# Patient Record
Sex: Male | Born: 1985 | Race: Black or African American | Hispanic: No | Marital: Married | Smoking: Current every day smoker
Health system: Southern US, Community
[De-identification: ages and names within clinical notes are randomized; demographics above are authoritative.]

## PROBLEM LIST (undated history)

## (undated) DIAGNOSIS — R011 Cardiac murmur, unspecified: Secondary | ICD-10-CM

## (undated) DIAGNOSIS — J45909 Unspecified asthma, uncomplicated: Secondary | ICD-10-CM

## (undated) DIAGNOSIS — T7840XA Allergy, unspecified, initial encounter: Secondary | ICD-10-CM

## (undated) HISTORY — PX: COLONOSCOPY: SHX174

## (undated) HISTORY — DX: Allergy, unspecified, initial encounter: T78.40XA

## (undated) HISTORY — DX: Cardiac murmur, unspecified: R01.1

---
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
## 2007-12-26 ENCOUNTER — Emergency Department (HOSPITAL_COMMUNITY): Admission: EM | Admit: 2007-12-26 | Discharge: 2007-12-26 | Payer: Self-pay | Admitting: Emergency Medicine

## 2009-07-13 ENCOUNTER — Emergency Department (HOSPITAL_COMMUNITY): Admission: EM | Admit: 2009-07-13 | Discharge: 2009-07-13 | Payer: Self-pay | Admitting: Emergency Medicine

## 2009-10-07 ENCOUNTER — Emergency Department (HOSPITAL_COMMUNITY): Admission: EM | Admit: 2009-10-07 | Discharge: 2009-10-07 | Payer: Self-pay | Admitting: Emergency Medicine

## 2009-10-08 ENCOUNTER — Emergency Department (HOSPITAL_COMMUNITY): Admission: EM | Admit: 2009-10-08 | Discharge: 2009-10-08 | Payer: Self-pay | Admitting: Emergency Medicine

## 2010-03-22 ENCOUNTER — Emergency Department (HOSPITAL_COMMUNITY)
Admission: EM | Admit: 2010-03-22 | Discharge: 2010-03-22 | Payer: Self-pay | Source: Home / Self Care | Admitting: Emergency Medicine

## 2010-03-27 LAB — URINALYSIS, ROUTINE W REFLEX MICROSCOPIC
Bilirubin Urine: NEGATIVE
Ketones, ur: NEGATIVE mg/dL
Leukocytes, UA: NEGATIVE
Nitrite: NEGATIVE
Protein, ur: NEGATIVE mg/dL
Specific Gravity, Urine: 1.03 (ref 1.005–1.030)
Urine Glucose, Fasting: NEGATIVE mg/dL
Urobilinogen, UA: 0.2 mg/dL (ref 0.0–1.0)
pH: 5.5 (ref 5.0–8.0)

## 2010-03-27 LAB — URINE MICROSCOPIC-ADD ON

## 2010-06-25 ENCOUNTER — Emergency Department (HOSPITAL_COMMUNITY)
Admission: EM | Admit: 2010-06-25 | Discharge: 2010-06-25 | Disposition: A | Discharge status: Home / Self Care | Payer: Self-pay | Attending: Emergency Medicine | Admitting: Emergency Medicine

## 2010-06-25 DIAGNOSIS — R040 Epistaxis: Secondary | ICD-10-CM | POA: Insufficient documentation

## 2010-07-28 NOTE — Procedures (Signed)
Dennis Dyer, Dennis Dyer              ACCOUNT NO.:  [id number]   MEDICAL RECORD NO.:  [id number]          PATIENT TYPE:  EMS   LOCATION:  ED                            FACILITY:  [facility]   PHYSICIAN:  Edward L. Juanetta Gosling, M.D.DATE OF BIRTH:  05/18/1985   DATE OF PROCEDURE:  08/09/2004  DATE OF DISCHARGE:  08/09/2004                                EKG INTERPRETATION   Time is 1428, Aug 09, 2004.   The rhythm is sinus rhythm with a rate in the 60s. There is ST elevation  which is widespread and very well may indicate pericarditis versus early  repolarization. This could represent something like a myocardial infarction,  but with the patient's age, I think it is much more likely to represent  pericarditis or early repolarization, but clinical correlation is suggested.  Abnormal electrocardiogram.       ELH/MEDQ  D:  08/11/2004  T:  08/12/2004  Job:  [id number]

## 2010-07-28 NOTE — Procedures (Signed)
[name], Dennis Dyer              ACCOUNT NO.:  [id number]   MEDICAL RECORD NO.:  [id number]          PATIENT TYPE:  EMS   LOCATION:  ED                            FACILITY:  [facility]   PHYSICIAN:  Edward L. Juanetta Gosling, M.D.DATE OF BIRTH:  [birth year]   DATE OF PROCEDURE:  08/09/2004  DATE OF DISCHARGE:  08/09/2004                                EKG INTERPRETATION   TIME OF PROCEDURE:  1438.   PROCEDURE:  EKG.   DESCRIPTION OF PROCEDURE:  The rhythm is a sinus rhythm with a rate of 60.  There is left ventricular hypertrophy.  There is widespread ST elevation.  This could be from pericarditis or early repolarization.  Clinical  correlation is suggested.       ELH/MEDQ  D:  08/21/2004  T:  08/21/2004  Job:  [id number]

## 2010-08-16 ENCOUNTER — Emergency Department (HOSPITAL_COMMUNITY): Payer: Self-pay

## 2010-08-16 ENCOUNTER — Emergency Department (HOSPITAL_COMMUNITY)
Admission: EM | Admit: 2010-08-16 | Discharge: 2010-08-16 | Disposition: A | Discharge status: Home / Self Care | Payer: Self-pay | Attending: Emergency Medicine | Admitting: Emergency Medicine

## 2010-08-16 DIAGNOSIS — Y998 Other external cause status: Secondary | ICD-10-CM | POA: Insufficient documentation

## 2010-08-16 DIAGNOSIS — F17200 Nicotine dependence, unspecified, uncomplicated: Secondary | ICD-10-CM | POA: Insufficient documentation

## 2010-08-16 DIAGNOSIS — R296 Repeated falls: Secondary | ICD-10-CM | POA: Insufficient documentation

## 2010-08-16 DIAGNOSIS — M65839 Other synovitis and tenosynovitis, unspecified forearm: Secondary | ICD-10-CM | POA: Insufficient documentation

## 2010-08-16 DIAGNOSIS — Y92009 Unspecified place in unspecified non-institutional (private) residence as the place of occurrence of the external cause: Secondary | ICD-10-CM | POA: Insufficient documentation

## 2010-09-08 ENCOUNTER — Emergency Department (HOSPITAL_COMMUNITY)
Admission: EM | Admit: 2010-09-08 | Discharge: 2010-09-08 | Disposition: A | Discharge status: Home / Self Care | Payer: Self-pay | Attending: Emergency Medicine | Admitting: Emergency Medicine

## 2010-09-08 DIAGNOSIS — M79609 Pain in unspecified limb: Secondary | ICD-10-CM | POA: Insufficient documentation

## 2010-09-08 DIAGNOSIS — M6530 Trigger finger, unspecified finger: Secondary | ICD-10-CM | POA: Insufficient documentation

## 2014-09-25 ENCOUNTER — Emergency Department (HOSPITAL_COMMUNITY)
Admission: EM | Admit: 2014-09-25 | Discharge: 2014-09-25 | Disposition: A | Discharge status: Home / Self Care | Payer: Self-pay | Attending: Emergency Medicine | Admitting: Emergency Medicine

## 2014-09-25 ENCOUNTER — Encounter (HOSPITAL_COMMUNITY): Payer: Self-pay | Admitting: Emergency Medicine

## 2014-09-25 ENCOUNTER — Emergency Department (HOSPITAL_COMMUNITY): Payer: Self-pay

## 2014-09-25 DIAGNOSIS — Y9289 Other specified places as the place of occurrence of the external cause: Secondary | ICD-10-CM | POA: Insufficient documentation

## 2014-09-25 DIAGNOSIS — S6992XA Unspecified injury of left wrist, hand and finger(s), initial encounter: Secondary | ICD-10-CM | POA: Insufficient documentation

## 2014-09-25 DIAGNOSIS — Y990 Civilian activity done for income or pay: Secondary | ICD-10-CM | POA: Insufficient documentation

## 2014-09-25 DIAGNOSIS — Z720 Tobacco use: Secondary | ICD-10-CM | POA: Insufficient documentation

## 2014-09-25 DIAGNOSIS — J45909 Unspecified asthma, uncomplicated: Secondary | ICD-10-CM | POA: Insufficient documentation

## 2014-09-25 DIAGNOSIS — X503XXA Overexertion from repetitive movements, initial encounter: Secondary | ICD-10-CM

## 2014-09-25 DIAGNOSIS — M25532 Pain in left wrist: Secondary | ICD-10-CM

## 2014-09-25 DIAGNOSIS — Y9389 Activity, other specified: Secondary | ICD-10-CM | POA: Insufficient documentation

## 2014-09-25 DIAGNOSIS — T148XXA Other injury of unspecified body region, initial encounter: Secondary | ICD-10-CM

## 2014-09-25 DIAGNOSIS — X58XXXA Exposure to other specified factors, initial encounter: Secondary | ICD-10-CM | POA: Insufficient documentation

## 2014-09-25 HISTORY — DX: Unspecified asthma, uncomplicated: J45.909

## 2014-09-25 MED ORDER — OXYCODONE-ACETAMINOPHEN 5-325 MG PO TABS
2.0000 | ORAL_TABLET | Freq: Once | ORAL | Status: AC
  Administered 2014-09-25: 2 via ORAL
  Filled 2014-09-25: qty 2

## 2014-09-25 MED ORDER — NAPROXEN 375 MG PO TABS: 375.0000 mg | ORAL_TABLET | Freq: Three times a day (TID) | ORAL | Status: DC | PRN

## 2014-09-25 MED ORDER — IBUPROFEN 400 MG PO TABS
600.0000 mg | ORAL_TABLET | Freq: Once | ORAL | Status: AC
Start: 2014-09-25 — End: 2014-09-25
  Administered 2014-09-25: 800 mg via ORAL
  Filled 2014-09-25: qty 2

## 2014-09-25 MED ORDER — TRAMADOL HCL 50 MG PO TABS: 50.0000 mg | ORAL_TABLET | Freq: Four times a day (QID) | ORAL | Status: DC | PRN

## 2014-09-25 NOTE — Discharge Instructions (Signed)
Muscle Strain A muscle strain is an injury that occurs when a muscle is stretched beyond its normal length. Usually a small number of muscle fibers are torn when this happens. Muscle strain is rated in degrees. First-degree strains have the least amount of muscle fiber tearing and pain. Second-degree and third-degree strains have increasingly more tearing and pain.  Usually, recovery from muscle strain takes 1-2 weeks. Complete healing takes 5-6 weeks.  CAUSES  Muscle strain happens when a sudden, violent force placed on a muscle stretches it too far. This may occur with lifting, sports, or a fall.  RISK FACTORS Muscle strain is especially common in athletes.  SIGNS AND SYMPTOMS At the site of the muscle strain, there may be:  Pain.  Bruising.  Swelling.  Difficulty using the muscle due to pain or lack of normal function. DIAGNOSIS  Your health care provider will perform a physical exam and ask about your medical history. TREATMENT  Often, the best treatment for a muscle strain is resting, icing, and applying cold compresses to the injured area.  HOME CARE INSTRUCTIONS   Use the PRICE method of treatment to promote muscle healing during the first 2-3 days after your injury. The PRICE method involves:  Protecting the muscle from being injured again.  Restricting your activity and resting the injured body part.  Icing your injury. To do this, put ice in a plastic bag. Place a towel between your skin and the bag. Then, apply the ice and leave it on from 15-20 minutes each hour. After the third day, switch to moist heat packs.  Apply compression to the injured area with a splint or elastic bandage. Be careful not to wrap it too tightly. This may interfere with blood circulation or increase swelling.  Elevate the injured body part above the level of your heart as often as you can.  Only take over-the-counter or prescription medicines for pain, discomfort, or fever as directed by your  health care provider.  Warming up prior to exercise helps to prevent future muscle strains. SEEK MEDICAL CARE IF:   You have increasing pain or swelling in the injured area.  You have numbness, tingling, or a significant loss of strength in the injured area. MAKE SURE YOU:   Understand these instructions.  Will watch your condition.  Will get help right away if you are not doing well or get worse. Document Released: 02/26/2005 Document Revised: 12/17/2012 Document Reviewed: 09/25/2012 [facility] Patient Information 2015 [place], [place]. This information is not intended to replace advice given to you by your health care provider. Make sure you discuss any questions you have with your health care provider.  Wrist Pain A wrist sprain happens when the bands of tissue that hold the wrist joints together (ligament) stretch too much or tear. A wrist strain happens when muscles or bands of tissue that connect muscles to bones (tendons) are stretched or pulled. HOME CARE  Put ice on the injured area.  Put ice in a plastic bag.  Place a towel between your skin and the bag.  Leave the ice on for 15-20 minutes, 03-04 times a day, for the first 2 days.  Raise (elevate) the injured wrist to lessen puffiness (swelling).  Rest the injured wrist for at least 48 hours or as told by your doctor.  Wear a splint, cast, or an elastic wrap as told by your doctor.  Only take medicine as told by your doctor.  Follow up with your doctor as told. This is  important. GET HELP RIGHT AWAY IF:   The fingers are puffy, very red, white, or cold and blue.  The fingers lose feeling (numb) or tingle.  The pain gets worse.  It is hard to move the fingers. MAKE SURE YOU:   Understand these instructions.  Will watch your condition.  Will get help right away if you are not doing well or get worse. Document Released: 08/15/2007 Document Revised: 05/21/2011 Document Reviewed: 04/19/2010 [facility] Patient  Information 2015 [place], [place]. This information is not intended to replace advice given to you by your health care provider. Make sure you discuss any questions you have with your health care provider.

## 2014-09-25 NOTE — ED Notes (Signed)
Pt c/o left wrist pain x 2 months worsening today.

## 2014-10-03 NOTE — ED Provider Notes (Signed)
CSN: [id number]     Arrival date & time 09/25/14  1817 History   First MD Initiated Contact with Patient 09/25/14 1824     Chief Complaint  Patient presents with  . Wrist Pain     (Consider location/radiation/quality/duration/timing/severity/associated sxs/prior Treatment) HPI   29 year old male with left wrist pain. His left wrist has been "bugging" him for months. Worse the past couple days. 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 No numbness or tingling. Has been taking Tylenol with minimal relief. No fevers or chills. No rash. Denies any significant pain in any other joints.  Past Medical History  Diagnosis Date  . Asthma    History reviewed. No pertinent past surgical history. No family history on file. History  Substance Use Topics  . Smoking status: Current Every Day Smoker -- 1.00 packs/day    Types: Cigarettes  . Smokeless tobacco: Not on file  . Alcohol Use: Yes     Comment: occasional    Review of Systems  All systems reviewed and negative, other than as noted in HPI.   Allergies  Banana and Tomato  Home Medications   Prior to Admission medications   Medication Sig Start Date End Date Taking? Authorizing Provider  naproxen (NAPROSYN) 375 MG tablet Take 1 tablet (375 mg total) by mouth 3 (three) times daily with meals as needed. 09/25/14   Raeford Razor, MD  traMADol (ULTRAM) 50 MG tablet Take 1 tablet (50 mg total) by mouth every 6 (six) hours as needed. 09/25/14   Raeford Razor, MD   BP 113/60 mmHg  Pulse 84  Temp(Src) 98.4 F (36.9 C) (Oral)  Resp 22  Ht 5\' 10"  (1.778 m)  Wt 145 lb (65.772 kg)  BMI 20.81 kg/m2  SpO2 98% Physical Exam  Constitutional: He appears well-developed and well-nourished. No distress.  HENT:  Head: Normocephalic and atraumatic.  Eyes: Conjunctivae are normal. Right eye exhibits no discharge. Left eye exhibits no discharge.  Neck: Neck supple.   Cardiovascular: Normal rate, regular rhythm and normal heart sounds.  Exam reveals no gallop and no friction rub.   No murmur heard. Pulmonary/Chest: Effort normal and breath sounds normal. No respiratory distress.  Abdominal: Soft. He exhibits no distension. There is no tenderness.  Musculoskeletal: He exhibits edema and tenderness.  Swelling of the left wrist. Pain with range of motion. Neurovascularly intact distally.  Neurological: He is alert.  Skin: Skin is warm and dry.  Psychiatric: He has a normal mood and affect. His behavior is normal. Thought content normal.  Nursing note and vitals reviewed.   ED Course  Procedures (including critical care time) Labs Review Labs Reviewed - No data to display  Imaging Review No results found.   Dg Wrist Complete Left  09/25/2014   CLINICAL DATA:  Left wrist pain for 2 months known injury, initial encounter  EXAM: LEFT WRIST - COMPLETE 3+ VIEW  COMPARISON:  None.  FINDINGS: There is no evidence of fracture or dislocation. There is no evidence of arthropathy or other focal bone abnormality. Soft tissues are unremarkable.  IMPRESSION: No acute abnormality noted.   Electronically Signed   By: Alcide Clever M.D.   On: 09/25/2014 19:14   EKG Interpretation None      MDM   Final diagnoses:  Overuse injury  Left wrist pain    29 year old male with wrist pain. Likely overuse injury. 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 Imaged without acute abnormality. Neurovascular intact. Advised that he should try to rest the area the best he can. Splint given for comfort reasons. When necessary medicines.    Raeford Razor, MD 10/03/14 [phone]

## 2015-01-03 ENCOUNTER — Encounter (HOSPITAL_COMMUNITY): Payer: Self-pay | Admitting: Emergency Medicine

## 2015-01-03 ENCOUNTER — Emergency Department (HOSPITAL_COMMUNITY)
Admission: EM | Admit: 2015-01-03 | Discharge: 2015-01-03 | Disposition: A | Discharge status: Home / Self Care | Payer: Self-pay | Attending: Emergency Medicine | Admitting: Emergency Medicine

## 2015-01-03 DIAGNOSIS — K648 Other hemorrhoids: Secondary | ICD-10-CM | POA: Insufficient documentation

## 2015-01-03 DIAGNOSIS — K649 Unspecified hemorrhoids: Secondary | ICD-10-CM

## 2015-01-03 DIAGNOSIS — J45909 Unspecified asthma, uncomplicated: Secondary | ICD-10-CM | POA: Insufficient documentation

## 2015-01-03 DIAGNOSIS — Z720 Tobacco use: Secondary | ICD-10-CM | POA: Insufficient documentation

## 2015-01-03 MED ORDER — NITROGLYCERIN 0.4 % RE OINT: 1.0000 [in_us] | TOPICAL_OINTMENT | Freq: Two times a day (BID) | RECTAL | Status: DC

## 2015-01-03 NOTE — Discharge Instructions (Signed)
Hemorrhoids °Hemorrhoids are swollen veins around the rectum or anus. There are two types of hemorrhoids:  °1. Internal hemorrhoids. These occur in the veins just inside the rectum. They may poke through to the outside and become irritated and painful. °2. External hemorrhoids. These occur in the veins outside the anus and can be felt as a painful swelling or hard lump near the anus. °CAUSES °· Pregnancy.   °· Obesity.   °· Constipation or diarrhea.   °· Straining to have a bowel movement.   °· Sitting for long periods on the toilet. °· Heavy lifting or other activity that caused you to strain. °· Anal intercourse. °SYMPTOMS  °· Pain.   °· Anal itching or irritation.   °· Rectal bleeding.   °· Fecal leakage.   °· Anal swelling.   °· One or more lumps around the anus.   °DIAGNOSIS  °Your caregiver may be able to diagnose hemorrhoids by visual examination. Other examinations or tests that may be performed include:  °· Examination of the rectal area with a gloved hand (digital rectal exam).   °· Examination of anal canal using a small tube (scope).   °· A blood test if you have lost a significant amount of blood. °· A test to look inside the colon (sigmoidoscopy or colonoscopy). °TREATMENT °Most hemorrhoids can be treated at home. However, if symptoms do not seem to be getting better or if you have a lot of rectal bleeding, your caregiver may perform a procedure to help make the hemorrhoids get smaller or remove them completely. Possible treatments include:  °· Placing a rubber band at the base of the hemorrhoid to cut off the circulation (rubber band ligation).   °· Injecting a chemical to shrink the hemorrhoid (sclerotherapy).   °· Using a tool to burn the hemorrhoid (infrared light therapy).   °· Surgically removing the hemorrhoid (hemorrhoidectomy).   °· Stapling the hemorrhoid to block blood flow to the tissue (hemorrhoid stapling).   °HOME CARE INSTRUCTIONS  °· Eat foods with fiber, such as whole grains, beans,  nuts, fruits, and vegetables. Ask your doctor about taking products with added fiber in them (fiber supplements). °· Increase fluid intake. Drink enough water and fluids to keep your urine clear or pale yellow.   °· Exercise regularly.   °· Go to the bathroom when you have the urge to have a bowel movement. Do not wait.   °· Avoid straining to have bowel movements.   °· Keep the anal area dry and clean. Use wet toilet paper or moist towelettes after a bowel movement.   °· Medicated creams and suppositories may be used or applied as directed.   °· Only take over-the-counter or prescription medicines as directed by your caregiver.   °· Take warm sitz baths for 15-20 minutes, 3-4 times a day to ease pain and discomfort.   °· Place ice packs on the hemorrhoids if they are tender and swollen. Using ice packs between sitz baths may be helpful.   °¨ Put ice in a plastic bag.   °¨ Place a towel between your skin and the bag.   °¨ Leave the ice on for 15-20 minutes, 3-4 times a day.   °· Do not use a donut-shaped pillow or sit on the toilet for long periods. This increases blood pooling and pain.   °SEEK MEDICAL CARE IF: °· You have increasing pain and swelling that is not controlled by treatment or medicine. °· You have uncontrolled bleeding. °· You have difficulty or you are unable to have a bowel movement. °· You have pain or inflammation outside the area of the hemorrhoids. °MAKE SURE YOU: °· Understand these instructions. °·   Will watch your condition. °· Will get help right away if you are not doing well or get worse. °  °This information is not intended to replace advice given to you by your health care provider. Make sure you discuss any questions you have with your health care provider. °  °Document Released: 02/24/2000 Document Revised: 02/13/2012 Document Reviewed: 01/01/2012 °Elsevier Interactive Patient Education ©2016 [employer]vier Inc. ° °Disposable Sitz Bath °A disposable sitz bath is a plastic basin that fits over  the toilet. A bag is hung above the toilet and is connected to a tube that opens into the disposable sitz bath. The bag is filled with warm water that can flow into the basin through the tube.  °HOW TO USE A DISPOSABLE SITZ BATH °3. Close the clamp on the tubing before filling the bag with water. This is to prevent leakage. °4. Fill the sitz bath basin and the plastic bag with warm water. °5. Place the filled basin on the toilet with the seat raised. Make sure the overflow opening is facing toward the back of the toilet. °6. Hang the filled plastic bag overhead on a hook or towel rack close to the toilet. When the bag is unclamped, a steady stream of water will flow from the bag, through the tubing, and into the basin. °7. Attach the tubing to the opening on the basin. °8. Sit on the basin positioned on the toilet seat and release the clamp. This will allow warm water to flush the area around your genitals and anus (perineum). °9. Remain sitting on the basin for approximately 15 to 20 minutes. °10. Stand up and pat the perineum area dry. If needed, apply clean bandages (dressings) to the affected area. °11. Tip the basin into the toilet to remove any remaining water and flush the toilet. °12. Wash the basin with warm water and soap. Let it dry in the sink. °13. Store the basin and tubing in a clean, dry area. °14. Wash your hands with soap and water. °SEEK MEDICAL CARE IF: °You get worse instead of better. Stop the sitz baths if you get worse. °MAKE SURE YOU: °· Understand these instructions. °· Will watch your condition. °· Will get help right away if you are not doing well or get worse. °  °This information is not intended to replace advice given to you by your health care provider. Make sure you discuss any questions you have with your health care provider. °  °Document Released: 08/28/2011 Document Revised: 11/21/2011 Document Reviewed: 08/28/2011 °Elsevier Interactive Patient Education ©2016 Elsevier Inc. ° °

## 2015-01-03 NOTE — ED Provider Notes (Signed)
CSN: [id number]     Arrival date & time 01/03/15  1512 History   First MD Initiated Contact with Patient 01/03/15 1541     Chief Complaint  Patient presents with  . Hemorrhoids     (Consider location/radiation/quality/duration/timing/severity/associated sxs/prior Treatment) HPI Comments: 7/10 times has pain with BM Hemorrhoids coming out, pushing them back in, one year When it first happened came to ED Tried witch hazel pads Preparation h cream Suppository Sitz baths helped, but next day returns Bleeding with most BM, bright red blood No bleeding between BM Pain rectal worse with BM, "spikes" 8/10   Past Medical History  Diagnosis Date  . Asthma    History reviewed. No pertinent past surgical history. History reviewed. No pertinent family history. Social History  Substance Use Topics  . Smoking status: Current Every Day Smoker -- 1.00 packs/day    Types: Cigarettes  . Smokeless tobacco: None  . Alcohol Use: Yes     Comment: occasional    Review of Systems  Constitutional: Negative for fever.  HENT: Negative for sore throat.   Eyes: Negative for visual disturbance.  Respiratory: Negative for shortness of breath.   Cardiovascular: Negative for chest pain.  Gastrointestinal: Positive for anal bleeding and rectal pain. Negative for nausea, vomiting, abdominal pain, diarrhea and constipation.  Genitourinary: Negative for difficulty urinating.  Musculoskeletal: Negative for back pain and neck stiffness.  Skin: Negative for rash.  Neurological: Negative for syncope and headaches.      Allergies  Banana and Tomato  Home Medications   Prior to Admission medications   Medication Sig Start Date End Date Taking? Authorizing Provider  Nitroglycerin 0.4 % OINT Place 1 inch rectally every 12 (twelve) hours. 01/03/15 01/24/15  Alvira Monday, MD   BP 122/74 mmHg  Pulse 61  Temp(Src) 97.8 F (36.6 C) (Oral)  Resp 14  Ht  (1.778 m)  Wt 145 lb (65.772 kg)  BMI  20.81 kg/m2  SpO2 100% Physical Exam  Constitutional: He is oriented to person, place, and time. He appears well-developed and well-nourished. No distress.  HENT:  Head: Normocephalic and atraumatic.  Eyes: Conjunctivae and EOM are normal.  Neck: Normal range of motion.  Cardiovascular: Normal rate, regular rhythm, normal heart sounds and intact distal pulses.  Exam reveals no gallop and no friction rub.   No murmur heard. Pulmonary/Chest: Effort normal and breath sounds normal. No respiratory distress. He has no wheezes. He has no rales.  Abdominal: Soft. He exhibits no distension. There is no tenderness. There is no guarding.  Genitourinary: Rectal exam shows internal hemorrhoid (palpated ) and tenderness. Rectal exam shows no external hemorrhoid, no fissure and anal tone normal (high rectal tone).  Musculoskeletal: He exhibits no edema.  Neurological: He is alert and oriented to person, place, and time.  Skin: Skin is warm and dry. He is not diaphoretic.  Nursing note and vitals reviewed.   ED Course  Procedures (including critical care time) Labs Review Labs Reviewed - No data to display  Imaging Review No results found. I have personally reviewed and evaluated these images and lab results as part of my medical decision-making.   EKG Interpretation None      MDM   Final diagnoses:  Hemorrhoids, unspecified hemorrhoid type   28yo male with history of hemorrhoids for one year with colonoscopy 6 mos ago confirming presence of internal hemorrhoids, presents with concern for continuing rectal bleeding with BM, rectal pain with BM after trying several conservative treatments.  Exam  shows no external hemorrhoids, however likely previously diagnosed internal hemorrhoids palpated on exam and hx is consistent with this. Given description and pt hemodynamically stable,, do not feel blood work is indicated at this time. Bleedling likely from hemorrhodis as it is continuing from time  previous to colonoscopy confirming this.  Given pt has tried several conservative treatments and comes seeking possible surgical removal, will refer to outpatient surgery.  Recommend continuing sitz baths, wrote rx for nitroglycerin for rectal spasm relief. Patient discharged in stable condition with understanding of reasons to return.     Alvira MondayErin Ousmane Seeman, MD 01/04/15 1340

## 2015-01-03 NOTE — ED Notes (Signed)
Having hemorrhoids for last year.  Rates pain 8/10, but currently not having any pain.

## 2015-04-19 ENCOUNTER — Encounter (HOSPITAL_COMMUNITY): Payer: Self-pay

## 2015-04-19 ENCOUNTER — Emergency Department (HOSPITAL_COMMUNITY): Payer: Self-pay

## 2015-04-19 ENCOUNTER — Emergency Department (HOSPITAL_COMMUNITY)
Admission: EM | Admit: 2015-04-19 | Discharge: 2015-04-19 | Disposition: A | Discharge status: Home / Self Care | Payer: Self-pay | Attending: Emergency Medicine | Admitting: Emergency Medicine

## 2015-04-19 DIAGNOSIS — Y998 Other external cause status: Secondary | ICD-10-CM | POA: Insufficient documentation

## 2015-04-19 DIAGNOSIS — W458XXA Other foreign body or object entering through skin, initial encounter: Secondary | ICD-10-CM | POA: Insufficient documentation

## 2015-04-19 DIAGNOSIS — F17210 Nicotine dependence, cigarettes, uncomplicated: Secondary | ICD-10-CM | POA: Insufficient documentation

## 2015-04-19 DIAGNOSIS — J45909 Unspecified asthma, uncomplicated: Secondary | ICD-10-CM | POA: Insufficient documentation

## 2015-04-19 DIAGNOSIS — Y9389 Activity, other specified: Secondary | ICD-10-CM | POA: Insufficient documentation

## 2015-04-19 DIAGNOSIS — S60454A Superficial foreign body of right ring finger, initial encounter: Secondary | ICD-10-CM | POA: Insufficient documentation

## 2015-04-19 DIAGNOSIS — Y9289 Other specified places as the place of occurrence of the external cause: Secondary | ICD-10-CM | POA: Insufficient documentation

## 2015-04-19 DIAGNOSIS — S60459A Superficial foreign body of unspecified finger, initial encounter: Secondary | ICD-10-CM

## 2015-04-19 MED ORDER — LIDOCAINE HCL (PF) 1 % IJ SOLN
5.0000 mL | Freq: Once | INTRAMUSCULAR | Status: AC
Start: 2015-04-19 — End: 2015-04-19
  Administered 2015-04-19: 5 mL
  Filled 2015-04-19: qty 5

## 2015-04-19 MED ORDER — TETANUS-DIPHTH-ACELL PERTUSSIS 5-2.5-18.5 LF-MCG/0.5 IM SUSP
0.5000 mL | Freq: Once | INTRAMUSCULAR | Status: AC
  Administered 2015-04-19: 0.5 mL via INTRAMUSCULAR
  Filled 2015-04-19: qty 0.5

## 2015-04-19 NOTE — Discharge Instructions (Signed)
Sliver Removal, Care After A sliver--also called a splinter--is a small and thin broken piece of an object that gets stuck (embedded) under the skin. A sliver can create a deep wound that can easily become infected. It is important to care for the wound after a sliver is removed to help prevent infection and other problems from developing. WHAT TO EXPECT AFTER THE PROCEDURE Slivers often break into smaller pieces when they are removed. If pieces of your sliver broke off and stayed in your skin, you will eventually see them working themselves out and you may feel some pain at the wound site. This is normal. HOME CARE INSTRUCTIONS  Keep all follow-up visits as directed by your health care provider. This is important.  There are many different ways to close and cover a wound, including stitches (sutures) and adhesive strips. Follow your health care provider's instructions about:  Wound care.  Bandage (dressing) changes and removal.  Wound closure removal.  Check the wound site every day for signs of infection. Watch for:  Red streaks coming from the wound.  Fever.  Redness or tenderness around the wound.  Fluid, blood, or pus coming from the wound.  A bad smell coming from the wound. SEEK MEDICAL CARE IF:  You think that a piece of the sliver is still in your skin.  Your wound was closed, as with sutures, and the edges of the wound break open.  You have signs of infection, including:  New or worsening redness around the wound.  New or worsening tenderness around the wound.  Fluid, blood, or pus coming from the wound.  A bad smell coming from the wound or dressing. SEEK IMMEDIATE MEDICAL CARE IF: You have any of the following signs of infection:  Red streaks coming from the wound.  An unexplained fever.   This information is not intended to replace advice given to you by your health care provider. Make sure you discuss any questions you have with your health care  provider.   Document Released: 02/24/2000 Document Revised: 03/19/2014 Document Reviewed: 10/29/2013 Elsevier Interactive Patient Education 2016 [employer]vier Inc.  

## 2015-04-19 NOTE — ED Notes (Signed)
Pt reports has a piece of glass in r ring finger x 3 months.

## 2015-04-19 NOTE — ED Provider Notes (Signed)
CSN: [id number]     Arrival date & time 04/19/15  4332 History   First MD Initiated Contact with Patient 04/19/15 0900     Chief Complaint  Patient presents with  . Foreign Body in Skin     (Consider location/radiation/quality/duration/timing/severity/associated sxs/prior Treatment) HPI Comments: Patient is a 30 year old male who presents to the emergency department with complaint of "I have a piece of glass in my finger" The patient states that approximately 3 months ago he was helping someone move and he got 2 pieces of glass in the right ring finger. He was able to remove one of them, but was unable to remove the other. He presents now because there is a sore raised area on the tip of the ring finger. He has not had fever or chills. There's been minimal drainage from this area. He has not noted red streaking. He states that he is not on any anticoagulation medications. And he does not have any medical issues that would reduce his immune response..  The history is provided by the patient.    Past Medical History  Diagnosis Date  . Asthma    History reviewed. No pertinent past surgical history. No family history on file. Social History  Substance Use Topics  . Smoking status: Current Every Day Smoker -- 1.00 packs/day    Types: Cigarettes  . Smokeless tobacco: None  . Alcohol Use: Yes     Comment: occasional    Review of Systems  Constitutional: Negative for activity change.       All ROS Neg except as noted in HPI  HENT: Negative for nosebleeds.   Eyes: Negative for photophobia and discharge.  Respiratory: Negative for cough, shortness of breath and wheezing.   Cardiovascular: Negative for chest pain and palpitations.  Gastrointestinal: Negative for abdominal pain and blood in stool.  Genitourinary: Negative for dysuria, frequency and hematuria.  Musculoskeletal: Negative for back pain, arthralgias and neck pain.  Skin: Negative.   Neurological: Negative for dizziness,  seizures and speech difficulty.  Psychiatric/Behavioral: Negative for hallucinations and confusion.      Allergies  Banana and Tomato  Home Medications   Prior to Admission medications   Medication Sig Start Date End Date Taking? Authorizing Provider  phenylephrine-shark liver oil-mineral oil-petrolatum (PREPARATION H) 0.25-3-14-71.9 % rectal ointment Place 1 application rectally 2 (two) times daily as needed for hemorrhoids.   Yes Historical Provider, MD  Nitroglycerin 0.4 % OINT Place 1 inch rectally every 12 (twelve) hours. 01/03/15 01/24/15  Alvira Monday, MD   BP 122/77 mmHg  Pulse 56  Temp(Src) 97.7 F (36.5 C) (Oral)  Resp 16  Ht  (1.803 m)  Wt 65.772 kg  BMI 20.23 kg/m2  SpO2 100% Physical Exam  Constitutional: He is oriented to person, place, and time. He appears well-developed and well-nourished.  Non-toxic appearance.  HENT:  Head: Normocephalic.  Right Ear: Tympanic membrane and external ear normal.  Left Ear: Tympanic membrane and external ear normal.  Eyes: EOM and lids are normal. Pupils are equal, round, and reactive to light.  Neck: Normal range of motion. Neck supple. Carotid bruit is not present.  Cardiovascular: Normal rate, regular rhythm, normal heart sounds, intact distal pulses and normal pulses.   Pulmonary/Chest: Breath sounds normal. No respiratory distress.  Abdominal: Soft. Bowel sounds are normal. There is no tenderness. There is no guarding.  Musculoskeletal: Normal range of motion.  There is a small raised tender area on the palmar surface of the distal right ring  finger. There is full range of motion of all fingers of the right and left hand.  Lymphadenopathy:       Head (right side): No submandibular adenopathy present.       Head (left side): No submandibular adenopathy present.    He has no cervical adenopathy.  Neurological: He is alert and oriented to person, place, and time. He has normal strength. No cranial nerve deficit or  sensory deficit.  Skin: Skin is warm and dry.  Psychiatric: He has a normal mood and affect. His speech is normal.  Nursing note and vitals reviewed.   ED Course  .Foreign Body Removal Date/Time: 04/19/2015 11:13 AM Performed by: Ivery Quale Authorized by: Ivery Quale Consent: Verbal consent obtained. Risks and benefits: risks, benefits and alternatives were discussed Consent given by: patient Patient understanding: patient states understanding of the procedure being performed Patient identity confirmed: arm band Time out: Immediately prior to procedure a "time out" was called to verify the correct patient, procedure, equipment, support staff and site/side marked as required. Body area: skin General location: upper extremity Location details: right ring finger Anesthesia: digital block Local anesthetic: lidocaine 1% without epinephrine Patient sedated: no Localization method: xray and prob. Removal mechanism: scalpel and forceps Dressing: dressing applied Tendon involvement: none Depth: subcutaneous Complexity: simple 1 objects recovered. Objects recovered: glass Post-procedure assessment: foreign body removed Patient tolerance: Patient tolerated the procedure well with no immediate complications   (including critical care time) Labs Review Labs Reviewed - No data to display  Imaging Review Dg Finger Ring Right  04/19/2015  CLINICAL DATA:  Pt states he was helping someone move about 2 months ago and got 2 pieces of glass in right ring finger/he states he got one piece out but still has one in the tip of finger EXAM: RIGHT RING FINGER 2+V COMPARISON:  None. FINDINGS: There is a subtle linear density within the anterior soft tissues of the right ring finger tip measuring approximate 3.5 mm in length. This is consistent with a retained piece of glass. No other evidence of a radiopaque foreign body. No fracture. Joints normally spaced and aligned. No bone resorption is seen to  suggest osteomyelitis. IMPRESSION: Small radiopaque foreign body consistent with glass in the anterior soft tissues of the right ring finger tip. Electronically Signed   By: Amie Portland M.D.   On: 04/19/2015 09:24   I have personally reviewed and evaluated these images and lab results as part of my medical decision-making.   EKG Interpretation None      MDM Vital signs are well within normal limits. X-ray of the ring finger shows a 3.5 mm foreign body in the anterior soft tissue of the ring finger.  The foreign body was removed and the finger irrigated. The patient's tetanus status was updated, and sterile bandage was applied. Discussed with the patient that in the event any tiny piece of the glass may not have been retrieved that it will be removed by the body itself. Discussed with the patient signs of infection. Patient is in agreement with the discharge plan.    Final diagnoses:  None    *I have reviewed nursing notes, vital signs, and all appropriate lab and imaging results for this patient.Ivery Quale, PA-C 04/19/15 1116  Linwood Dibbles, MD 04/19/15 [phone]

## 2015-09-28 ENCOUNTER — Emergency Department (HOSPITAL_COMMUNITY)
Admission: EM | Admit: 2015-09-28 | Discharge: 2015-09-28 | Disposition: A | Discharge status: Home / Self Care | Payer: Self-pay | Attending: Emergency Medicine | Admitting: Emergency Medicine

## 2015-09-28 ENCOUNTER — Emergency Department (HOSPITAL_COMMUNITY): Payer: Self-pay

## 2015-09-28 ENCOUNTER — Encounter (HOSPITAL_COMMUNITY): Payer: Self-pay | Admitting: Emergency Medicine

## 2015-09-28 DIAGNOSIS — F17210 Nicotine dependence, cigarettes, uncomplicated: Secondary | ICD-10-CM | POA: Insufficient documentation

## 2015-09-28 DIAGNOSIS — K648 Other hemorrhoids: Secondary | ICD-10-CM | POA: Insufficient documentation

## 2015-09-28 DIAGNOSIS — J45909 Unspecified asthma, uncomplicated: Secondary | ICD-10-CM | POA: Insufficient documentation

## 2015-09-28 DIAGNOSIS — M546 Pain in thoracic spine: Secondary | ICD-10-CM

## 2015-09-28 NOTE — Discharge Instructions (Signed)

## 2015-09-28 NOTE — ED Provider Notes (Signed)
CSN: [id number]     Arrival date & time 09/28/15  2049 History   First MD Initiated Contact with Patient 09/28/15 2122     Chief Complaint  Patient presents with  . Hemorrhoids  . Back Pain    HPI   30 year old male presents today with numerous complaints. Patient reports that over the last 3 years he's had hemorrhoids. He describes them as intermittent, and uncomfortable. He reports that he has intermittent bloody stools, described as streaking. He reports that the hemorrhoid bulges out of the rectum when he has a bowel movement, he reports he is able to manually reduce it on his own. He denies any significant pain with this. Patient reports symptoms were worse this morning, but resolved at this time my evaluation.  Patient also reports that he is having left back pain. He describes this as a "lump" feeling in his mid thoracic and posterior rib. Patient is concerned that there is something in his lungs, he denies any cough, fever, weight loss, night sweats. He denies any history of cancer, but does report that he is a smoker. Patient requesting chest x-ray for further evaluation.  Past Medical History  Diagnosis Date  . Asthma    History reviewed. No pertinent past surgical history. History reviewed. No pertinent family history. Social History  Substance Use Topics  . Smoking status: Current Every Day Smoker -- 1.00 packs/day    Types: Cigarettes  . Smokeless tobacco: None  . Alcohol Use: Yes     Comment: occasional    Review of Systems  All other systems reviewed and are negative.     Allergies  Banana and Tomato  Home Medications   Prior to Admission medications   Medication Sig Start Date End Date Taking? Authorizing Provider  Nitroglycerin 0.4 % OINT Place 1 inch rectally every 12 (twelve) hours. 01/03/15 01/24/15  Alvira Monday, MD  phenylephrine-shark liver oil-mineral oil-petrolatum (PREPARATION H) 0.25-3-14-71.9 % rectal ointment Place 1 application rectally 2  (two) times daily as needed for hemorrhoids.    Historical Provider, MD   Pulse 71  Temp(Src) 98.9 F (37.2 C) (Oral)  Resp 16  Ht  (1.778 m)  Wt 65.403 kg  BMI 20.69 kg/m2  SpO2 100%   Physical Exam  Constitutional: He is oriented to person, place, and time. He appears well-developed and well-nourished.  HENT:  Head: Normocephalic and atraumatic.  Eyes: Conjunctivae are normal. Pupils are equal, round, and reactive to light. Right eye exhibits no discharge. Left eye exhibits no discharge. No scleral icterus.  Neck: Normal range of motion. No JVD present. No tracheal deviation present.  Pulmonary/Chest: Effort normal and breath sounds normal. No stridor. No respiratory distress. He has no wheezes. He has no rales. He exhibits no tenderness.  Musculoskeletal: Normal range of motion. He exhibits no edema or tenderness.  Neurological: He is alert and oriented to person, place, and time. Coordination normal.  Skin: Skin is warm and dry. No rash noted. No erythema. No pallor.  Psychiatric: He has a normal mood and affect. His behavior is normal. Judgment and thought content normal.  Nursing note and vitals reviewed.   ED Course  Procedures (including critical care time) Labs Review Labs Reviewed - No data to display  Imaging Review Dg Chest 2 View  09/28/2015  CLINICAL DATA:  LEFT upper back pain, rib pain for several months. History of asthma. EXAM: CHEST  2 VIEW COMPARISON:  Chest radiograph October 07, 2009 FINDINGS: Cardiomediastinal silhouette is normal. The  lungs are clear without pleural effusions or focal consolidations. Trachea projects midline and there is no pneumothorax. Soft tissue planes and included osseous structures are non-suspicious. IMPRESSION: Normal chest. Electronically Signed   By: Awilda Metroourtnay  Bloomer M.D.   On: 09/28/2015 22:47   I have personally reviewed and evaluated these images and lab results as part of my medical decision-making.   EKG  Interpretation None      MDM   Final diagnoses:  Internal hemorrhoid  Left-sided thoracic back pain   Labs:  Imaging:DG chest 2 view  Consults:  Therapeutics:  Discharge Meds:   Assessment/Plan:  Patient presents with numerous complaints. Patient has what is likely reducible internal hemorrhoids. Patient reports she is having minimal discomfort at the time my evaluation. He will need follow-up with proctologist or gastroenterologist, no need to proceed with rectal exam on today's exam as he will be following up with him for definitive management. Patient has no red flags for cancer. Patient also having back pain, he reports he feels a "lump in the posterior aspect of his back and is worried that it might be in his lungs. He is requesting chest x-ray. Chest x-ray normal here, again no cancerous signs or symptoms. Patient encouraged to discontinue smoking, follow-up with primary care for reevaluation and further management. Patient had no further questions or concerns at the time discharge.       Eyvonne MechanicJeffrey Garlon Tuggle, PA-C 09/29/15 [id number]  Eber HongBrian Miller, MD 09/30/15 [phone]

## 2015-09-28 NOTE — ED Notes (Signed)
Pt states he has hemorrhoids and has upper L. Sided back pain.

## 2016-07-04 ENCOUNTER — Emergency Department (HOSPITAL_COMMUNITY)
Admission: EM | Admit: 2016-07-04 | Discharge: 2016-07-04 | Disposition: A | Discharge status: Home / Self Care | Payer: Self-pay | Attending: Emergency Medicine | Admitting: Emergency Medicine

## 2016-07-04 ENCOUNTER — Encounter (HOSPITAL_COMMUNITY): Payer: Self-pay | Admitting: Cardiology

## 2016-07-04 DIAGNOSIS — K648 Other hemorrhoids: Secondary | ICD-10-CM | POA: Insufficient documentation

## 2016-07-04 DIAGNOSIS — F17210 Nicotine dependence, cigarettes, uncomplicated: Secondary | ICD-10-CM | POA: Insufficient documentation

## 2016-07-04 DIAGNOSIS — J45909 Unspecified asthma, uncomplicated: Secondary | ICD-10-CM | POA: Insufficient documentation

## 2016-07-04 MED ORDER — HYDROCORTISONE ACETATE 25 MG RE SUPP: 25.0000 mg | Freq: Two times a day (BID) | RECTAL | 0 refills | Status: DC

## 2016-07-04 NOTE — Discharge Instructions (Signed)
Please follow up with GI specialist as previously scheduled for further management of your rectal discomfort.  Eat food high in fiber to prevent constipation.  Avoid heavy lifting.  Use Anusol as needed for discomfort.

## 2016-07-04 NOTE — ED Provider Notes (Signed)
[facility]-EMER[facility]ENCY DEPT Provider Note   CSN: [id number] Arrival date & time: 07/04/16  1112     History   Chief Complaint Chief Complaint  Patient presents with  . Hemorrhoids    HPI Dennis Dyer is a 31 y.o. male.  HPI   31 year old male with history of hemorrhoids presenting complaining of rectal pain. Patient report 2 years ago after heavy lifting he developed a hemorrhoid. He did had a colonoscopy at that time that was unremarkable. Since then he has had pain to his rectum but for the past 4 months and has becoming more regular, more intense and more painful. Describe pain as a soreness burning sensation with itchiness at his rectum with protrusions of the hemorrhoid that he can fill. He has tried over-the-counter medication including cooling gel, Preparation H, lidocaine cream without adequate improvement. He did have an appointment with a GI specialist on May 5 but he is here requesting for treatment of his symptoms. He denies having fever, abdominal pain, constipation. Does report occasional blood per rectum this is not new. Denies any instrumentation or injury to his rectum.  Past Medical History:  Diagnosis Date  . Asthma     There are no active problems to display for this patient.   History reviewed. No pertinent surgical history.     Home Medications    Prior to Admission medications   Medication Sig Start Date End Date Taking? Authorizing Provider  Nitroglycerin 0.4 % OINT Place 1 inch rectally every 12 (twelve) hours. 01/03/15 01/24/15  Alvira Monday, MD  phenylephrine-shark liver oil-mineral oil-petrolatum (PREPARATION H) 0.25-3-14-71.9 % rectal ointment Place 1 application rectally 2 (two) times daily as needed for hemorrhoids.    Historical Provider, MD    Family History History reviewed. No pertinent family history.  Social History Social History  Substance Use Topics  . Smoking status: Current Every Day Smoker    Packs/day: 1.00    Types:  Cigarettes  . Smokeless tobacco: Not on file  . Alcohol use Yes     Comment: occasional     Allergies   Banana and Tomato   Review of Systems Review of Systems  Constitutional: Negative for fever.  Gastrointestinal: Positive for rectal pain.  Skin: Negative for rash and wound.  All other systems reviewed and are negative.    Physical Exam Updated Vital Signs BP 131/84   Pulse 87   Temp 99 F (37.2 C) (Oral)   Resp 16   Ht  (1.778 m)   Wt 63.5 kg   SpO2 98%   BMI 20.09 kg/m   Physical Exam  Constitutional: He appears well-developed and well-nourished. No distress.  HENT:  Head: Atraumatic.  Eyes: Conjunctivae are normal.  Neck: Neck supple.  Abdominal: Soft. He exhibits no distension. There is no tenderness.  Genitourinary:  Genitourinary Comments: Chaperone present during exam. Normal external anus with normal rectal tone, no obvious external hemorrhoid noted. No signs of thrombosed hemorrhoid. Discomfort with digital rectal exam however normal color stool on glove, normal prostates, no anal fissure, no signs of purulent rectal abscess.  Neurological: He is alert.  Skin: No rash noted.  Psychiatric: He has a normal mood and affect.  Nursing note and vitals reviewed.    ED Treatments / Results  Labs (all labs ordered are listed, but only abnormal results are displayed) Labs Reviewed - No data to display  EKG  EKG Interpretation None       Radiology No results found.  Procedures Procedures (  including critical care time)  Medications Ordered in ED Medications - No data to display   Initial Impression / Assessment and Plan / ED Course  I have reviewed the triage vital signs and the nursing notes.  Pertinent labs & imaging results that were available during my care of the patient were reviewed by me and considered in my medical decision making (see chart for details).     BP 131/84   Pulse 87   Temp 99 F (37.2 C) (Oral)   Resp 16    Ht  (1.778 m)   Wt 63.5 kg   SpO2 98%   BMI 20.09 kg/m    Final Clinical Impressions(s) / ED Diagnoses   Final diagnoses:  Internal hemorrhoid    New Prescriptions New Prescriptions   HYDROCORTISONE (ANUSOL-HC) 25 MG SUPPOSITORY    Place 1 suppository (25 mg total) rectally 2 (two) times daily. For 7 days   12:04 PM Pt here with recurrent rectal pain, hx of hemorrhoid.  Does have appointment with GI specialist in 2 weeks.  No evidence of thrombosed hemorrhoid, anal fissure, perirectal abscess or mass noted.  Will prescribe anusol and outpt f/u with GI specialist for further care. Has been seen for same in the past.    Fayrene Helper, PA-C 07/04/16 1207    Vanetta Mulders, MD 07/05/16 [phone]

## 2016-07-04 NOTE — ED Triage Notes (Signed)
Rectal pain for 2 years.  Appointment to see surgeon ,  But states I can't wait.

## 2016-07-11 ENCOUNTER — Ambulatory Visit (INDEPENDENT_AMBULATORY_CARE_PROVIDER_SITE_OTHER): Payer: Self-pay | Admitting: Family Medicine

## 2016-07-11 ENCOUNTER — Encounter: Payer: Self-pay | Admitting: Family Medicine

## 2016-07-11 VITALS — BP 112/70 | HR 68 | Temp 99.4°F | Resp 18 | Ht 70.5 in | Wt 143.0 lb

## 2016-07-11 DIAGNOSIS — Z91018 Allergy to other foods: Secondary | ICD-10-CM | POA: Insufficient documentation

## 2016-07-11 DIAGNOSIS — Z720 Tobacco use: Secondary | ICD-10-CM

## 2016-07-11 DIAGNOSIS — Z7689 Persons encountering health services in other specified circumstances: Secondary | ICD-10-CM

## 2016-07-11 DIAGNOSIS — Z8709 Personal history of other diseases of the respiratory system: Secondary | ICD-10-CM

## 2016-07-11 DIAGNOSIS — L309 Dermatitis, unspecified: Secondary | ICD-10-CM | POA: Insufficient documentation

## 2016-07-11 DIAGNOSIS — K648 Other hemorrhoids: Secondary | ICD-10-CM | POA: Insufficient documentation

## 2016-07-11 NOTE — Patient Instructions (Signed)
Drink lots of water Take a stool softener daily ( like senna or colace) Use the OTC hemorrhoid creams Take ibuprofen 800 mg three times a day with food for pain We have placed referral to surgery Call me for questions or problems

## 2016-07-11 NOTE — Progress Notes (Signed)
Chief Complaint  Patient presents with  . Establish Care   ER follow up New to establish Miserable with symptomatic hemorrhoids Has used "every OTC product" When he has a BM they bleed and protrude, he "tucks myself back in" each time Prior flare in 2016.  He had a colonoscopy.  No surgery   Patient Active Problem List   Diagnosis Date Noted  . Tobacco abuse 07/11/2016  . Internal hemorrhoids 07/11/2016  . History of asthma 07/11/2016  . Eczema 07/11/2016  . Food allergy 07/11/2016    Outpatient Encounter Prescriptions as of 07/11/2016  Medication Sig  . hydrocortisone (ANUSOL-HC) 25 MG suppository Place 1 suppository (25 mg total) rectally 2 (two) times daily. For 7 days  . phenylephrine-shark liver oil-mineral oil-petrolatum (PREPARATION H) 0.25-3-14-71.9 % rectal ointment Place 1 application rectally 2 (two) times daily as needed for hemorrhoids.  . Nitroglycerin 0.4 % OINT Place 1 inch rectally every 12 (twelve) hours.   No facility-administered encounter medications on file as of 07/11/2016.     Past Medical History:  Diagnosis Date  . Allergy   . Asthma   . Heart murmur    History reviewed. No pertinent surgical history.  Social History   Social History  . Marital status: Married    Spouse name: N/A  . Number of children: 3  . Years of education: 12   Occupational History  . self     [employer]iture moving [employer]   Social History Main Topics  . Smoking status: Current Every Day Smoker    Packs/day: 1.00    Types: Cigarettes    Start date: 03/12/2008  . Smokeless tobacco: Never Used  . Alcohol use Yes     Comment: occasional  . Drug use: Yes    Types: Marijuana     Comment: occasional  . Sexual activity: Yes    Birth control/ protection: None   Other Topics Concern  . Not on file   Social History Narrative   married, three children   Owns furniture moving company       Family History  Problem Relation Age of Onset  . Hypertension Mother   .  Alcohol abuse Father     Review of Systems  Constitutional: Negative for chills, fever and weight loss.  HENT: Negative for congestion and hearing loss.   Eyes: Negative for blurred vision and pain.  Respiratory: Negative for cough and shortness of breath.   Cardiovascular: Negative for chest pain and leg swelling.  Gastrointestinal: Positive for blood in stool. Negative for abdominal pain, constipation, diarrhea and heartburn.       Rectal pain  Genitourinary: Negative for dysuria and frequency.  Musculoskeletal: Negative for falls, joint pain and myalgias.  Neurological: Negative for dizziness, seizures and headaches.  Psychiatric/Behavioral: Negative for depression. The patient is not nervous/anxious and does not have insomnia.     BP 112/70 (BP Location: Right Arm, Patient Position: Sitting, Cuff Size: Normal)   Pulse 68   Temp 99.4 F (37.4 C) (Temporal)   Resp 18   Ht 5' 10.5" (1.791 m)   Wt 143 lb (64.9 kg)   SpO2 99%   BMI 20.23 kg/m   Physical Exam  Constitutional: He is oriented to person, place, and time. He appears well-developed and well-nourished.  HENT:  Head: Normocephalic and atraumatic.  Mouth/Throat: Oropharynx is clear and moist.  Eyes: Conjunctivae are normal. Pupils are equal, round, and reactive to light.  Neck: Normal range of motion.  Cardiovascular: Normal rate,  regular rhythm and normal heart sounds.   No ectopy  Pulmonary/Chest: Effort normal and breath sounds normal. No respiratory distress.  Abdominal: Soft. Bowel sounds are normal.  Genitourinary:  Genitourinary Comments: External rectum/anus is WNL  Musculoskeletal: Normal range of motion. He exhibits edema.  Lymphadenopathy:    He has no cervical adenopathy.  Neurological: He is alert and oriented to person, place, and time.  Gait normal  Skin: Skin is warm and dry.  Psychiatric: He has a normal mood and affect. His behavior is normal. Thought content normal.  Nursing note and vitals  reviewed. ASSESSMENT/PLAN:   1. Tobacco abuse discussed  2. Internal hemorrhoids  - Ambulatory referral to General Surgery  3. History of asthma childhood  4. Eczema, unspecified type Uses OTC lotions  5. Encounter to establish care with new doctor   6. Food allergy recorded   Patient Instructions  Drink lots of water Take a stool softener daily ( like senna or colace) Use the OTC hemorrhoid creams Take ibuprofen 800 mg three times a day with food for pain We have placed referral to surgery Call me for questions or problems   Eustace Moore, MD

## 2016-07-12 ENCOUNTER — Encounter (HOSPITAL_COMMUNITY)
Admission: RE | Admit: 2016-07-12 | Discharge: 2016-07-12 | Disposition: A | Discharge status: Home / Self Care | Payer: Self-pay | Source: Ambulatory Visit | Attending: General Surgery | Admitting: General Surgery

## 2016-07-12 ENCOUNTER — Ambulatory Visit (INDEPENDENT_AMBULATORY_CARE_PROVIDER_SITE_OTHER): Payer: Self-pay | Admitting: General Surgery

## 2016-07-12 ENCOUNTER — Ambulatory Visit: Payer: Self-pay | Admitting: General Surgery

## 2016-07-12 ENCOUNTER — Encounter: Payer: Self-pay | Admitting: General Surgery

## 2016-07-12 ENCOUNTER — Encounter (HOSPITAL_COMMUNITY): Payer: Self-pay

## 2016-07-12 VITALS — BP 132/79 | HR 81 | Temp 98.6°F | Resp 18 | Ht 71.0 in | Wt 146.0 lb

## 2016-07-12 DIAGNOSIS — Z01812 Encounter for preprocedural laboratory examination: Secondary | ICD-10-CM | POA: Insufficient documentation

## 2016-07-12 DIAGNOSIS — K648 Other hemorrhoids: Secondary | ICD-10-CM

## 2016-07-12 LAB — CBC WITH DIFFERENTIAL/PLATELET
Basophils Absolute: 0 10*3/uL (ref 0.0–0.1)
Basophils Relative: 0 %
EOS PCT: 2 %
Eosinophils Absolute: 0.2 10*3/uL (ref 0.0–0.7)
HEMATOCRIT: 40.7 % (ref 39.0–52.0)
Hemoglobin: 14.2 g/dL (ref 13.0–17.0)
LYMPHS ABS: 3.4 10*3/uL (ref 0.7–4.0)
LYMPHS PCT: 40 %
MCH: 32.6 pg (ref 26.0–34.0)
MCHC: 34.9 g/dL (ref 30.0–36.0)
MCV: 93.3 fL (ref 78.0–100.0)
MONO ABS: 0.4 10*3/uL (ref 0.1–1.0)
Monocytes Relative: 5 %
NEUTROS ABS: 4.5 10*3/uL (ref 1.7–7.7)
NEUTROS PCT: 53 %
PLATELETS: 205 10*3/uL (ref 150–400)
RBC: 4.36 MIL/uL (ref 4.22–5.81)
RDW: 11.8 % (ref 11.5–15.5)
WBC: 8.5 10*3/uL (ref 4.0–10.5)

## 2016-07-12 LAB — BASIC METABOLIC PANEL
Anion gap: 8 (ref 5–15)
BUN: 25 mg/dL — AB (ref 6–20)
CALCIUM: 9.6 mg/dL (ref 8.9–10.3)
CO2: 25 mmol/L (ref 22–32)
CREATININE: 1.13 mg/dL (ref 0.61–1.24)
Chloride: 103 mmol/L (ref 101–111)
GFR calc Af Amer: >60 mL/min (ref >60)
GLUCOSE: 85 mg/dL (ref 65–99)
Potassium: 3.8 mmol/L (ref 3.5–5.1)
Sodium: 136 mmol/L (ref 135–145)

## 2016-07-12 LAB — RAPID URINE DRUG SCREEN, HOSP PERFORMED
Amphetamines: NOT DETECTED
BARBITURATES: NOT DETECTED
BENZODIAZEPINES: NOT DETECTED
Cocaine: NOT DETECTED
Opiates: NOT DETECTED
Tetrahydrocannabinol: POSITIVE — AB

## 2016-07-12 MED ORDER — OXYCODONE-ACETAMINOPHEN 7.5-325 MG PO TABS: 1.0000 | ORAL_TABLET | ORAL | 0 refills | Status: DC | PRN

## 2016-07-12 NOTE — H&P (Signed)
Dennis Dyer; [id number]; [birth year]   HPI Patient is a 31 year old black male who presents for urgent evaluation and treatment of hemorrhoidal disease. He states he has had hemorrhoidal issues for 4 years, but recently he has had to push his hemorrhoids back in. He has a pain level of 4 out of 10. He denies constipation. He states he has had to frequently push the hemorrhoids and over the past 2 weeks. Over the past several days, it seems to have worsened. He denies any blood per rectum. He has never had hemorrhoid surgery before.     Past Medical History:  Diagnosis Date  . Allergy   . Asthma   . Heart murmur     No past surgical history on file.       Family History  Problem Relation Age of Onset  . Hypertension Mother   . Alcohol abuse Father           Current Outpatient Prescriptions on File Prior to Visit  Medication Sig Dispense Refill  . hydrocortisone (ANUSOL-HC) 25 MG suppository Place 1 suppository (25 mg total) rectally 2 (two) times daily. For 7 days 14 suppository 0  . Nitroglycerin 0.4 % OINT Place 1 inch rectally every 12 (twelve) hours. 30 g 0  . phenylephrine-shark liver oil-mineral oil-petrolatum (PREPARATION H) 0.25-3-14-71.9 % rectal ointment Place 1 application rectally 2 (two) times daily as needed for hemorrhoids.     No current facility-administered medications on file prior to visit.         Allergies  Allergen Reactions  . Banana Anaphylaxis  . Tomato Anaphylaxis        History  Alcohol Use  . Yes    Comment: occasional        History  Smoking Status  . Current Every Day Smoker  . Packs/day: 1.00  . Types: Cigarettes  . Start date: 03/12/2008  Smokeless Tobacco  . Never Used    Review of Systems  Constitutional: Negative.   HENT: Negative.   Eyes: Negative.   Respiratory: Negative.   Cardiovascular: Negative.   Gastrointestinal: Negative.  Negative for blood in stool and constipation.  Genitourinary:  Negative.   Musculoskeletal: Negative.   Skin: Negative.   Neurological: Negative.   Endo/Heme/Allergies: Negative.   Psychiatric/Behavioral: Negative.     Objective      Vitals:   07/12/16 1143  BP: 132/79  Pulse: 81  Resp: 18  Temp: 98.6 F (37 C)    Physical Exam  Constitutional: He is well-developed, well-nourished, and in no distress.  HENT:  Head: Normocephalic and atraumatic.  Neck: Normal range of motion. Neck supple.  Cardiovascular: Normal rate, regular rhythm and normal heart sounds.   No murmur heard. Pulmonary/Chest: Effort normal and breath sounds normal. He has no wheezes. He has no rales.  Abdominal: Soft. He exhibits no distension. There is no tenderness.  Genitourinary:  Genitourinary Comments: Rectal examination reveals prolapsed hemorrhoids that are very large and prominent in 3 areas of the anus. The right side seems to be the most significant. They were reducible. No active bleeding noted. They are painful to palpation.  Vitals reviewed.   Assessment  Prolapsing hemorrhoidal disease Plan   Patient is scheduled for an extensive hemorrhoidectomy on 07/16/2016. The risks and benefits of the procedure including bleeding, infection, pain, and recurrence of the hemorrhoidal disease due to its extensive nature were fully explained to the patient, who gave informed consent. He realizes that he may need further hemorrhoid surgery in the  future. Percocet prescribed for pain.

## 2016-07-12 NOTE — Progress Notes (Signed)
Dennis Dyer; [id number]; 08/23/1985   HPI Patient is a 31-year-old black male who presents for urgent evaluation and treatment of hemorrhoidal disease. He states he has had hemorrhoidal issues for 4 years, but recently he has had to push his hemorrhoids back in. He has a pain level of 4 out of 10. He denies constipation. He states he has had to frequently push the hemorrhoids and over the past 2 weeks. Over the past several days, it seems to have worsened. He denies any blood per rectum. He has never had hemorrhoid surgery before.     Past Medical History:  Diagnosis Date  . Allergy   . Asthma   . Heart murmur     No past surgical history on file.       Family History  Problem Relation Age of Onset  . Hypertension Mother   . Alcohol abuse Father           Current Outpatient Prescriptions on File Prior to Visit  Medication Sig Dispense Refill  . hydrocortisone (ANUSOL-HC) 25 MG suppository Place 1 suppository (25 mg total) rectally 2 (two) times daily. For 7 days 14 suppository 0  . Nitroglycerin 0.4 % OINT Place 1 inch rectally every 12 (twelve) hours. 30 g 0  . phenylephrine-shark liver oil-mineral oil-petrolatum (PREPARATION H) 0.25-3-14-71.9 % rectal ointment Place 1 application rectally 2 (two) times daily as needed for hemorrhoids.     No current facility-administered medications on file prior to visit.         Allergies  Allergen Reactions  . Banana Anaphylaxis  . Tomato Anaphylaxis        History  Alcohol Use  . Yes    Comment: occasional        History  Smoking Status  . Current Every Day Smoker  . Packs/day: 1.00  . Types: Cigarettes  . Start date: 03/12/2008  Smokeless Tobacco  . Never Used    Review of Systems  Constitutional: Negative.   HENT: Negative.   Eyes: Negative.   Respiratory: Negative.   Cardiovascular: Negative.   Gastrointestinal: Negative.  Negative for blood in stool and constipation.  Genitourinary:  Negative.   Musculoskeletal: Negative.   Skin: Negative.   Neurological: Negative.   Endo/Heme/Allergies: Negative.   Psychiatric/Behavioral: Negative.     Objective      Vitals:   07/12/16 1143  BP: 132/79  Pulse: 81  Resp: 18  Temp: 98.6 F (37 C)    Physical Exam  Constitutional: He is well-developed, well-nourished, and in no distress.  HENT:  Head: Normocephalic and atraumatic.  Neck: Normal range of motion. Neck supple.  Cardiovascular: Normal rate, regular rhythm and normal heart sounds.   No murmur heard. Pulmonary/Chest: Effort normal and breath sounds normal. He has no wheezes. He has no rales.  Abdominal: Soft. He exhibits no distension. There is no tenderness.  Genitourinary:  Genitourinary Comments: Rectal examination reveals prolapsed hemorrhoids that are very large and prominent in 3 areas of the anus. The right side seems to be the most significant. They were reducible. No active bleeding noted. They are painful to palpation.  Vitals reviewed.   Assessment  Prolapsing hemorrhoidal disease Plan   Patient is scheduled for an extensive hemorrhoidectomy on 07/16/2016. The risks and benefits of the procedure including bleeding, infection, pain, and recurrence of the hemorrhoidal disease due to its extensive nature were fully explained to the patient, who gave informed consent. He realizes that he may need further hemorrhoid surgery in the 

## 2016-07-12 NOTE — Patient Instructions (Signed)
Dennis Dyer  07/12/2016     @PREFPERIOPPHARMACY @   Your procedure is scheduled on  07/16/2016  Report to [facility] at  900  A.M.  Call this number if you have problems the morning of surgery:  [phone]   Remember:  Do not eat food or drink liquids after midnight.  Take these medicines the morning of surgery with A SIP OF WATER  percocet   Do not wear jewelry, make-up or nail polish.  Do not wear lotions, powders, or perfumes, or deoderant.  Do not shave 48 hours prior to surgery.  Men may shave face and neck.  Do not bring valuables to the hospital.  [facility] is not responsible for any belongings or valuables.  Contacts, dentures or bridgework may not be worn into surgery.  Leave your suitcase in the car.  After surgery it may be brought to your room.  For patients admitted to the hospital, discharge time will be determined by your treatment team.  Patients discharged the day of surgery will not be allowed to drive home.   Name and phone number of your driver:   family Special instructions:  None  Please read over the following fact sheets that you were given. Anesthesia Post-op Instructions and Care and Recovery After Surgery      Surgical Procedures for Hemorrhoids, Care After Refer to this sheet in the next few weeks. These instructions provide you with information about caring for yourself after your procedure. Your health care provider may also give you more specific instructions. Your treatment has been planned according to current medical practices, but problems sometimes occur. Call your health care provider if you have any problems or questions after your procedure. What can I expect after the procedure? After the procedure, it is common to have:  Rectal pain.  Pain when you are having a bowel movement.  Slight rectal bleeding. Follow these instructions at home: Medicines   Take over-the-counter and prescription medicines only as told by  your health care provider.  Do not drive or operate heavy machinery while taking prescription pain medicine.  Use a stool softener or a bulk laxative as told by your health care provider. Activity   Rest at home. Return to your normal activities as told by your health care provider.  Do not lift anything that is heavier than 10 lb (4.5 kg).  Do not sit for long periods of time. Take a walk every day or as told by your health care provider.  Do not strain to have a bowel movement. Do not spend a long time sitting on the toilet. Eating and drinking   Eat foods that contain fiber, such as whole grains, beans, nuts, fruits, and vegetables.  Drink enough fluid to keep your urine clear or pale yellow. General instructions   Sit in a warm bath 2-3 times per day to relieve soreness or itching.  Keep all follow-up visits as told by your health care provider. This is important. Contact a health care provider if:  Your pain medicine is not helping.  You have a fever or chills.  You become constipated.  You have trouble passing urine. Get help right away if:  You have very bad rectal pain.  You have heavy bleeding from your rectum. This information is not intended to replace advice given to you by your health care provider. Make sure you discuss any questions you have with your health care provider. Document Released:  05/19/2003 Document Revised: 08/04/2015 Document Reviewed: 05/24/2014 Elsevier Interactive Patient Education  2017 Elsevier Inc.  General Anesthesia, Adult General anesthesia is the use of medicines to make a person "go to sleep" (be unconscious) for a medical procedure. General anesthesia is often recommended when a procedure:  Is long.  Requires you to be still or in an unusual position.  Is major and can cause you to lose blood.  Is impossible to do without general anesthesia. The medicines used for general anesthesia are called general anesthetics. In  addition to making you sleep, the medicines:  Prevent pain.  Control your blood pressure.  Relax your muscles. Tell a health care provider about:  Any allergies you have.  All medicines you are taking, including vitamins, herbs, eye drops, creams, and over-the-counter medicines.  Any problems you or family members have had with anesthetic medicines.  Types of anesthetics you have had in the past.  Any bleeding disorders you have.  Any surgeries you have had.  Any medical conditions you have.  Any history of heart or lung conditions, such as heart failure, sleep apnea, or chronic obstructive pulmonary disease (COPD).  Whether you are pregnant or may be pregnant.  Whether you use tobacco, alcohol, marijuana, or street drugs.  Any history of Financial planner.  Any history of depression or anxiety. What are the risks? Generally, this is a safe procedure. However, problems may occur, including:  Allergic reaction to anesthetics.  Lung and heart problems.  Inhaling food or liquids from your stomach into your lungs (aspiration).  Injury to nerves.  Waking up during your procedure and being unable to move (rare).  Extreme agitation or a state of mental confusion (delirium) when you wake up from the anesthetic.  Air in the bloodstream, which can lead to stroke. These problems are more likely to develop if you are having a major surgery or if you have an advanced medical condition. You can prevent some of these complications by answering all of your health care provider's questions thoroughly and by following all pre-procedure instructions. General anesthesia can cause side effects, including:  Nausea or vomiting  A sore throat from the breathing tube.  Feeling cold or shivery.  Feeling tired, washed out, or achy.  Sleepiness or drowsiness.  Confusion or agitation. What happens before the procedure? Staying hydrated  Follow instructions from your health care  provider about hydration, which may include:  Up to 2 hours before the procedure - you may continue to drink clear liquids, such as water, clear fruit juice, black coffee, and plain tea. Eating and drinking restrictions  Follow instructions from your health care provider about eating and drinking, which may include:  8 hours before the procedure - stop eating heavy meals or foods such as meat, fried foods, or fatty foods.  6 hours before the procedure - stop eating light meals or foods, such as toast or cereal.  6 hours before the procedure - stop drinking milk or drinks that contain milk.  2 hours before the procedure - stop drinking clear liquids. Medicines   Ask your health care provider about:  Changing or stopping your regular medicines. This is especially important if you are taking diabetes medicines or blood thinners.  Taking medicines such as aspirin and ibuprofen. These medicines can thin your blood. Do not take these medicines before your procedure if your health care provider instructs you not to.  Taking new dietary supplements or medicines. Do not take these during the week before your  procedure unless your health care provider approves them.  If you are told to take a medicine or to continue taking a medicine on the day of the procedure, take the medicine with sips of water. General instructions    Ask if you will be going home the same day, the following day, or after a longer hospital stay.  Plan to have someone take you home.  Plan to have someone stay with you for the first 24 hours after you leave the hospital or clinic.  For 3-6 weeks before the procedure, try not to use any tobacco products, such as cigarettes, chewing tobacco, and e-cigarettes.  You may brush your teeth on the morning of the procedure, but make sure to spit out the toothpaste. What happens during the procedure?  You will be given anesthetics through a mask and through an IV tube in one of  your veins.  You may receive medicine to help you relax (sedative).  As soon as you are asleep, a breathing tube may be used to help you breathe.  An anesthesia specialist will stay with you throughout the procedure. He or she will help keep you comfortable and safe by continuing to give you medicines and adjusting the amount of medicine that you get. He or she will also watch your blood pressure, pulse, and oxygen levels to make sure that the anesthetics do not cause any problems.  If a breathing tube was used to help you breathe, it will be removed before you wake up. The procedure may vary among health care providers and hospitals. What happens after the procedure?  You will wake up, often slowly, after the procedure is complete, usually in a recovery area.  Your blood pressure, heart rate, breathing rate, and blood oxygen level will be monitored until the medicines you were given have worn off.  You may be given medicine to help you calm down if you feel anxious or agitated.  If you will be going home the same day, your health care provider may check to make sure you can stand, drink, and urinate.  Your health care providers will treat your pain and side effects before you go home.  Do not drive for 24 hours if you received a sedative.  You may:  Feel nauseous and vomit.  Have a sore throat.  Have mental slowness.  Feel cold or shivery.  Feel sleepy.  Feel tired.  Feel sore or achy, even in parts of your body where you did not have surgery. This information is not intended to replace advice given to you by your health care provider. Make sure you discuss any questions you have with your health care provider. Document Released: 06/05/2007 Document Revised: 08/09/2015 Document Reviewed: 02/10/2015 Elsevier Interactive Patient Education  2017 Elsevier Inc. General Anesthesia, Adult, Care After These instructions provide you with information about caring for yourself after  your procedure. Your health care provider may also give you more specific instructions. Your treatment has been planned according to current medical practices, but problems sometimes occur. Call your health care provider if you have any problems or questions after your procedure. What can I expect after the procedure? After the procedure, it is common to have:  Vomiting.  A sore throat.  Mental slowness. It is common to feel:  Nauseous.  Cold or shivery.  Sleepy.  Tired.  Sore or achy, even in parts of your body where you did not have surgery. Follow these instructions at home: For at least 24  hours after the procedure:   Do not:  Participate in activities where you could fall or become injured.  Drive.  Use heavy machinery.  Drink alcohol.  Take sleeping pills or medicines that cause drowsiness.  Make important decisions or sign legal documents.  Take care of children on your own.  Rest. Eating and drinking   If you vomit, drink water, juice, or soup when you can drink without vomiting.  Drink enough fluid to keep your urine clear or pale yellow.  Make sure you have little or no nausea before eating solid foods.  Follow the diet recommended by your health care provider. General instructions   Have a responsible adult stay with you until you are awake and alert.  Return to your normal activities as told by your health care provider. Ask your health care provider what activities are safe for you.  Take over-the-counter and prescription medicines only as told by your health care provider.  If you smoke, do not smoke without supervision.  Keep all follow-up visits as told by your health care provider. This is important. Contact a health care provider if:  You continue to have nausea or vomiting at home, and medicines are not helpful.  You cannot drink fluids or start eating again.  You cannot urinate after 8-12 hours.  You develop a skin rash.  You  have fever.  You have increasing redness at the site of your procedure. Get help right away if:  You have difficulty breathing.  You have chest pain.  You have unexpected bleeding.  You feel that you are having a life-threatening or urgent problem. This information is not intended to replace advice given to you by your health care provider. Make sure you discuss any questions you have with your health care provider. Document Released: 06/04/2000 Document Revised: 08/01/2015 Document Reviewed: 02/10/2015 Elsevier Interactive Patient Education  2017 [employer]

## 2016-07-12 NOTE — H&P (Deleted)
Dennis Dyer; [id number]; [birth year]   HPI Patient is a 31 year old black male who presents for urgent evaluation and treatment of hemorrhoidal disease. He states he has had hemorrhoidal issues for 4 years, but recently he has had to push his hemorrhoids back in. He has a pain level of 4 out of 10. He denies constipation. He states he has had to frequently push the hemorrhoids and over the past 2 weeks. Over the past several days, it seems to have worsened. He denies any blood per rectum. He has never had hemorrhoid surgery before.     Past Medical History:  Diagnosis Date  . Allergy   . Asthma   . Heart murmur     No past surgical history on file.       Family History  Problem Relation Age of Onset  . Hypertension Mother   . Alcohol abuse Father           Current Outpatient Prescriptions on File Prior to Visit  Medication Sig Dispense Refill  . hydrocortisone (ANUSOL-HC) 25 MG suppository Place 1 suppository (25 mg total) rectally 2 (two) times daily. For 7 days 14 suppository 0  . Nitroglycerin 0.4 % OINT Place 1 inch rectally every 12 (twelve) hours. 30 g 0  . phenylephrine-shark liver oil-mineral oil-petrolatum (PREPARATION H) 0.25-3-14-71.9 % rectal ointment Place 1 application rectally 2 (two) times daily as needed for hemorrhoids.     No current facility-administered medications on file prior to visit.         Allergies  Allergen Reactions  . Banana Anaphylaxis  . Tomato Anaphylaxis        History  Alcohol Use  . Yes    Comment: occasional        History  Smoking Status  . Current Every Day Smoker  . Packs/day: 1.00  . Types: Cigarettes  . Start date: 03/12/2008  Smokeless Tobacco  . Never Used    Review of Systems  Constitutional: Negative.   HENT: Negative.   Eyes: Negative.   Respiratory: Negative.   Cardiovascular: Negative.   Gastrointestinal: Negative.  Negative for blood in stool and constipation.  Genitourinary:  Negative.   Musculoskeletal: Negative.   Skin: Negative.   Neurological: Negative.   Endo/Heme/Allergies: Negative.   Psychiatric/Behavioral: Negative.     Objective      Vitals:   07/12/16 1143  BP: 132/79  Pulse: 81  Resp: 18  Temp: 98.6 F (37 C)    Physical Exam  Constitutional: He is well-developed, well-nourished, and in no distress.  HENT:  Head: Normocephalic and atraumatic.  Neck: Normal range of motion. Neck supple.  Cardiovascular: Normal rate, regular rhythm and normal heart sounds.   No murmur heard. Pulmonary/Chest: Effort normal and breath sounds normal. He has no wheezes. He has no rales.  Abdominal: Soft. He exhibits no distension. There is no tenderness.  Genitourinary:  Genitourinary Comments: Rectal examination reveals prolapsed hemorrhoids that are very large and prominent in 3 areas of the anus. The right side seems to be the most significant. They were reducible. No active bleeding noted. They are painful to palpation.  Vitals reviewed.

## 2016-07-12 NOTE — Patient Instructions (Signed)
Hemorrhoids Hemorrhoids are swollen veins in and around the rectum or anus. Hemorrhoids can cause pain, itching, or bleeding. Most of the time, they do not cause serious problems. They usually get better with diet changes, lifestyle changes, and other home treatments. Follow these instructions at home: Eating and drinking   Eat foods that have fiber, such as whole grains, beans, nuts, fruits, and vegetables. Ask your doctor about taking products that have added fiber (fibersupplements).  Drink enough fluid to keep your pee (urine) clear or pale yellow. For Pain and Swelling   Take a warm-water bath (sitz bath) for 20 minutes to ease pain. Do this 3-4 times a day.  If directed, put ice on the painful area. It may be helpful to use ice between your warm baths.  Put ice in a plastic bag.  Place a towel between your skin and the bag.  Leave the ice on for 20 minutes, 2-3 times a day. General instructions   Take over-the-counter and prescription medicines only as told by your doctor.  Medicated creams and medicines that are inserted into the anus (suppositories) may be used or applied as told.  Exercise often.  Go to the bathroom when you have the urge to poop (to have a bowel movement). Do not wait.  Avoid pushing too hard (straining) when you poop.  Keep the butt area dry and clean. Use wet toilet paper or moist paper towels.  Do not sit on the toilet for a long time. Contact a doctor if:  You have any of these:  Pain and swelling that do not get better with treatment or medicine.  Bleeding that will not stop.  Trouble pooping or you cannot poop.  Pain or swelling outside the area of the hemorrhoids. This information is not intended to replace advice given to you by your health care provider. Make sure you discuss any questions you have with your health care provider. Document Released: 12/06/2007 Document Revised: 08/04/2015 Document Reviewed: 11/10/2014 Elsevier  Interactive Patient Education  2017 Elsevier Inc.  

## 2016-07-16 ENCOUNTER — Ambulatory Visit (HOSPITAL_COMMUNITY): Payer: Self-pay | Admitting: Anesthesiology

## 2016-07-16 ENCOUNTER — Encounter (HOSPITAL_COMMUNITY): Payer: Self-pay | Admitting: *Deleted

## 2016-07-16 ENCOUNTER — Ambulatory Visit (HOSPITAL_COMMUNITY)
Admission: RE | Admit: 2016-07-16 | Discharge: 2016-07-16 | Disposition: A | Discharge status: Home / Self Care | Payer: Self-pay | Source: Ambulatory Visit | Attending: General Surgery | Admitting: General Surgery

## 2016-07-16 ENCOUNTER — Encounter (HOSPITAL_COMMUNITY)
Admission: RE | Disposition: A | Discharge status: Home / Self Care | Payer: Self-pay | Source: Ambulatory Visit | Attending: General Surgery

## 2016-07-16 DIAGNOSIS — K648 Other hemorrhoids: Secondary | ICD-10-CM | POA: Insufficient documentation

## 2016-07-16 DIAGNOSIS — F17210 Nicotine dependence, cigarettes, uncomplicated: Secondary | ICD-10-CM | POA: Insufficient documentation

## 2016-07-16 HISTORY — PX: HEMORRHOID SURGERY: SHX153

## 2016-07-16 SURGERY — HEMORRHOIDECTOMY
Anesthesia: General | Site: Rectum

## 2016-07-16 MED ORDER — FENTANYL CITRATE (PF) 100 MCG/2ML IJ SOLN
INTRAMUSCULAR | Status: DC | PRN
  Administered 2016-07-16: 50 ug via INTRAVENOUS
  Administered 2016-07-16 (×2): 25 ug via INTRAVENOUS
  Administered 2016-07-16: 50 ug via INTRAVENOUS

## 2016-07-16 MED ORDER — BUPIVACAINE HCL (PF) 0.5 % IJ SOLN
INTRAMUSCULAR | Status: DC | PRN
  Administered 2016-07-16: 10 mL

## 2016-07-16 MED ORDER — MIDAZOLAM HCL 2 MG/2ML IJ SOLN
INTRAMUSCULAR | Status: AC
  Filled 2016-07-16: qty 2

## 2016-07-16 MED ORDER — CHLORHEXIDINE GLUCONATE CLOTH 2 % EX PADS: 6.0000 | MEDICATED_PAD | Freq: Once | CUTANEOUS | Status: DC

## 2016-07-16 MED ORDER — FENTANYL CITRATE (PF) 100 MCG/2ML IJ SOLN
INTRAMUSCULAR | Status: AC
  Filled 2016-07-16: qty 2

## 2016-07-16 MED ORDER — LACTATED RINGERS IV SOLN
INTRAVENOUS | Status: DC
  Administered 2016-07-16: 10:00:00 via INTRAVENOUS

## 2016-07-16 MED ORDER — GLYCOPYRROLATE 0.2 MG/ML IJ SOLN
INTRAMUSCULAR | Status: AC
  Filled 2016-07-16: qty 1

## 2016-07-16 MED ORDER — MIDAZOLAM HCL 2 MG/2ML IJ SOLN
1.0000 mg | INTRAMUSCULAR | Status: AC
Start: 2016-07-16 — End: 2016-07-16
  Administered 2016-07-16: 2 mg via INTRAVENOUS

## 2016-07-16 MED ORDER — KETOROLAC TROMETHAMINE 30 MG/ML IJ SOLN
INTRAMUSCULAR | Status: AC
  Filled 2016-07-16: qty 1

## 2016-07-16 MED ORDER — PROPOFOL 10 MG/ML IV BOLUS
INTRAVENOUS | Status: DC | PRN
  Administered 2016-07-16: 200 mg via INTRAVENOUS
  Administered 2016-07-16: 100 mg via INTRAVENOUS

## 2016-07-16 MED ORDER — DEXTROSE 5 % IV SOLN
INTRAVENOUS | Status: DC | PRN
  Administered 2016-07-16: 2 g via INTRAVENOUS

## 2016-07-16 MED ORDER — OXYCODONE-ACETAMINOPHEN 7.5-325 MG PO TABS: 1.0000 | ORAL_TABLET | ORAL | 0 refills | Status: DC | PRN

## 2016-07-16 MED ORDER — KETOROLAC TROMETHAMINE 30 MG/ML IJ SOLN
30.0000 mg | Freq: Once | INTRAMUSCULAR | Status: AC
  Administered 2016-07-16: 30 mg via INTRAVENOUS

## 2016-07-16 MED ORDER — CEFOTETAN DISODIUM-DEXTROSE 2-2.08 GM-% IV SOLR
2.0000 g | INTRAVENOUS | Status: AC
  Administered 2016-07-16: 2 g via INTRAVENOUS
  Filled 2016-07-16: qty 50

## 2016-07-16 MED ORDER — ONDANSETRON HCL 4 MG/2ML IJ SOLN
4.0000 mg | Freq: Once | INTRAMUSCULAR | Status: AC
  Administered 2016-07-16: 4 mg via INTRAVENOUS

## 2016-07-16 MED ORDER — DIPHENHYDRAMINE HCL 50 MG/ML IJ SOLN
25.0000 mg | Freq: Once | INTRAMUSCULAR | Status: AC
  Administered 2016-07-16: 25 mg via INTRAVENOUS

## 2016-07-16 MED ORDER — CHLORHEXIDINE GLUCONATE CLOTH 2 % EX PADS
6.0000 | MEDICATED_PAD | Freq: Once | CUTANEOUS | Status: DC
Start: 2016-07-16 — End: 2016-07-16

## 2016-07-16 MED ORDER — DIPHENHYDRAMINE HCL 50 MG/ML IJ SOLN
INTRAMUSCULAR | Status: AC
  Filled 2016-07-16: qty 1

## 2016-07-16 MED ORDER — LIDOCAINE VISCOUS 2 % MT SOLN
OROMUCOSAL | Status: AC
  Filled 2016-07-16: qty 15

## 2016-07-16 MED ORDER — LIDOCAINE HCL (PF) 1 % IJ SOLN
INTRAMUSCULAR | Status: AC
  Filled 2016-07-16: qty 5

## 2016-07-16 MED ORDER — ONDANSETRON HCL 4 MG/2ML IJ SOLN
INTRAMUSCULAR | Status: AC
  Filled 2016-07-16: qty 2

## 2016-07-16 MED ORDER — SODIUM CHLORIDE 0.9 % IR SOLN
Status: DC | PRN
  Administered 2016-07-16: 1000 mL

## 2016-07-16 MED ORDER — LIDOCAINE HCL (CARDIAC) 20 MG/ML IV SOLN
INTRAVENOUS | Status: DC | PRN
  Administered 2016-07-16: 50 mg via INTRAVENOUS

## 2016-07-16 MED ORDER — PROPOFOL 10 MG/ML IV BOLUS
INTRAVENOUS | Status: AC
  Filled 2016-07-16: qty 20

## 2016-07-16 MED ORDER — LIDOCAINE VISCOUS 2 % MT SOLN
OROMUCOSAL | Status: DC | PRN
  Administered 2016-07-16: 1 via OROMUCOSAL

## 2016-07-16 MED ORDER — HYDROMORPHONE HCL 1 MG/ML IJ SOLN
0.2500 mg | INTRAMUSCULAR | Status: AC | PRN
  Administered 2016-07-16 (×8): 0.5 mg via INTRAVENOUS
  Filled 2016-07-16 (×4): qty 1

## 2016-07-16 MED ORDER — BUPIVACAINE-EPINEPHRINE (PF) 0.5% -1:200000 IJ SOLN
INTRAMUSCULAR | Status: AC
  Filled 2016-07-16: qty 30

## 2016-07-16 SURGICAL SUPPLY — 29 items
BAG HAMPER (MISCELLANEOUS) ×3 IMPLANT
CLOTH BEACON ORANGE TIMEOUT ST (SAFETY) ×3 IMPLANT
COVER LIGHT HANDLE STERIS (MISCELLANEOUS) ×6 IMPLANT
DECANTER SPIKE VIAL GLASS SM (MISCELLANEOUS) ×3 IMPLANT
DRAPE PROXIMA HALF (DRAPES) ×3 IMPLANT
ELECT REM PT RETURN 9FT ADLT (ELECTROSURGICAL) ×3
ELECTRODE REM PT RTRN 9FT ADLT (ELECTROSURGICAL) ×1 IMPLANT
FORMALIN 10 PREFIL 120ML (MISCELLANEOUS) ×3 IMPLANT
GAUZE SPONGE 4X4 12PLY STRL (GAUZE/BANDAGES/DRESSINGS) ×3 IMPLANT
GAUZE SPONGE 4X4 12PLY STRL LF (GAUZE/BANDAGES/DRESSINGS) ×3 IMPLANT
GLOVE BIOGEL PI IND STRL 7.0 (GLOVE) ×1 IMPLANT
GLOVE BIOGEL PI INDICATOR 7.0 (GLOVE) ×2
GLOVE SURG SS PI 7.5 STRL IVOR (GLOVE) ×6 IMPLANT
GOWN STRL REUS W/ TWL XL LVL3 (GOWN DISPOSABLE) ×1 IMPLANT
GOWN STRL REUS W/TWL LRG LVL3 (GOWN DISPOSABLE) ×3 IMPLANT
GOWN STRL REUS W/TWL XL LVL3 (GOWN DISPOSABLE) ×2
HEMOSTAT SURGICEL 4X8 (HEMOSTASIS) ×3 IMPLANT
KIT ROOM TURNOVER AP CYSTO (KITS) ×3 IMPLANT
LIGASURE IMPACT 36 18CM CVD LR (INSTRUMENTS) ×3 IMPLANT
MANIFOLD NEPTUNE II (INSTRUMENTS) ×3 IMPLANT
NEEDLE HYPO 25X1 1.5 SAFETY (NEEDLE) ×3 IMPLANT
NS IRRIG 1000ML POUR BTL (IV SOLUTION) ×3 IMPLANT
PACK PERI GYN (CUSTOM PROCEDURE TRAY) ×3 IMPLANT
PAD ARMBOARD 7.5X6 YLW CONV (MISCELLANEOUS) ×3 IMPLANT
SET BASIN LINEN APH (SET/KITS/TRAYS/PACK) ×3 IMPLANT
SURGILUBE 3G PEEL PACK STRL (MISCELLANEOUS) ×3 IMPLANT
SUT SILK 0 FSL (SUTURE) ×3 IMPLANT
SUT VIC AB 2-0 CT2 27 (SUTURE) IMPLANT
SYR CONTROL 10ML LL (SYRINGE) ×3 IMPLANT

## 2016-07-16 NOTE — Discharge Instructions (Signed)
Surgical Procedures for Hemorrhoids, Care After °Refer to this sheet in the next few weeks. These instructions provide you with information about caring for yourself after your procedure. Your health care provider may also give you more specific instructions. Your treatment has been planned according to current medical practices, but problems sometimes occur. Call your health care provider if you have any problems or questions after your procedure. °What can I expect after the procedure? °After the procedure, it is common to have: °· Rectal pain. °· Pain when you are having a bowel movement. °· Slight rectal bleeding. °Follow these instructions at home: °Medicines  °· Take over-the-counter and prescription medicines only as told by your health care provider. °· Do not drive or operate heavy machinery while taking prescription pain medicine. °· Use a stool softener or a bulk laxative as told by your health care provider. °Activity  °· Rest at home. Return to your normal activities as told by your health care provider. °· Do not lift anything that is heavier than 10 lb (4.5 kg). °· Do not sit for long periods of time. Take a walk every day or as told by your health care provider. °· Do not strain to have a bowel movement. Do not spend a long time sitting on the toilet. °Eating and drinking  °· Eat foods that contain fiber, such as whole grains, beans, nuts, fruits, and vegetables. °· Drink enough fluid to keep your urine clear or pale yellow. °General instructions  °· Sit in a warm bath 2-3 times per day to relieve soreness or itching. °· Keep all follow-up visits as told by your health care provider. This is important. °Contact a health care provider if: °· Your pain medicine is not helping. °· You have a fever or chills. °· You become constipated. °· You have trouble passing urine. °Get help right away if: °· You have very bad rectal pain. °· You have heavy bleeding from your rectum. °This information is not  intended to replace advice given to you by your health care provider. Make sure you discuss any questions you have with your health care provider. °Document Released: 05/19/2003 Document Revised: 08/04/2015 Document Reviewed: 05/24/2014 °Elsevier Interactive Patient Education © 2017 [employer] °PATIENT INSTRUCTIONS °POST-ANESTHESIA ° °IMMEDIATELY FOLLOWING SURGERY:  Do not drive or operate machinery for the first twenty four hours after surgery.  Do not make any important decisions for twenty four hours after surgery or while taking narcotic pain medications or sedatives.  If you develop intractable nausea and vomiting or a severe headache please notify your doctor immediately. ° °FOLLOW-UP:  Please make an appointment with your surgeon as instructed. You do not need to follow up with anesthesia unless specifically instructed to do so. ° °WOUND CARE INSTRUCTIONS (if applicable):  Keep a dry clean dressing on the anesthesia/puncture wound site if there is drainage.  Once the wound has quit draining you may leave it open to air.  Generally you should leave the bandage intact for twenty four hours unless there is drainage.  If the epidural site drains for more than 36-48 hours please call the anesthesia department. ° °QUESTIONS?:  Please feel free to call your physician or the hospital operator if you have any questions, and they will be happy to assist you.    ° ° ° ° °

## 2016-07-16 NOTE — Anesthesia Procedure Notes (Signed)
Procedure Name: LMA Insertion Date/Time: 07/16/2016 12:13 PM Performed by: Pernell DupreADAMS, AMY A Pre-anesthesia Checklist: Patient identified, Timeout performed, Emergency Drugs available, Suction available and Patient being monitored Patient Re-evaluated:Patient Re-evaluated prior to inductionOxygen Delivery Method: Circle system utilized Preoxygenation: Pre-oxygenation with 100% oxygen Intubation Type: IV induction Ventilation: Mask ventilation without difficulty LMA: LMA inserted LMA Size: 4.0 Number of attempts: 1 Placement Confirmation: positive ETCO2 and breath sounds checked- equal and bilateral Tube secured with: Tape Dental Injury: Teeth and Oropharynx as per pre-operative assessment

## 2016-07-16 NOTE — Interval H&P Note (Signed)
History and Physical Interval Note:  07/16/2016 11:47 AM  Dennis Dyer  has presented today for surgery, with the diagnosis of internal and external hemorrhoids  The various methods of treatment have been discussed with the patient and family. After consideration of risks, benefits and other options for treatment, the patient has consented to  Procedure(s): EXTENSIVE HEMORRHOIDECTOMY (N/A) as a surgical intervention .  The patient's history has been reviewed, patient examined, no change in status, stable for surgery.  I have reviewed the patient's chart and labs.  Questions were answered to the patient's satisfaction.     [name]

## 2016-07-16 NOTE — Progress Notes (Signed)
Patient arrived to post op area complaining of itching. No rash noted. Dr. Jayme CloudGonzalez notified. 25 mg of IV benadryl given as ordered by Dr. Jayme CloudGonzalez.

## 2016-07-16 NOTE — Anesthesia Procedure Notes (Signed)
Performed by: ADAMS, AMY A       

## 2016-07-16 NOTE — Transfer of Care (Signed)
Immediate Anesthesia Transfer of Care Note  Patient: Dennis Dyer  Procedure(s) Performed: Procedure(s): EXTENSIVE HEMORRHOIDECTOMY (N/A)  Patient Location: PACU  Anesthesia Type:General  Level of Consciousness: awake, alert , oriented and patient cooperative  Airway & Oxygen Therapy: Patient Spontanous Breathing  Post-op Assessment: Report given to RN and Post -op Vital signs reviewed and stable  Post vital signs: Reviewed and stable  Last Vitals:  Vitals:   07/16/16 1155 07/16/16 1200  BP: 120/73   Pulse:    Resp: (!) 48 11  Temp:      Last Pain:  Vitals:   07/16/16 0924  TempSrc: Oral  PainSc: 3       Patients Stated Pain Goal: 5 (07/16/16 0924)  Complications: No apparent anesthesia complications

## 2016-07-16 NOTE — Anesthesia Preprocedure Evaluation (Signed)
Anesthesia Evaluation  Patient identified by MRN, date of birth, ID band Patient awake    Airway Mallampati: I  TM Distance: >3 FB     Dental  (+) Teeth Intact   Pulmonary asthma , Current Smoker,    breath sounds clear to auscultation       Cardiovascular negative cardio ROS   Rhythm:Regular Rate:Normal     Neuro/Psych negative neurological ROS     GI/Hepatic negative GI ROS, (+)     substance abuse  marijuana use,   Endo/Other    Renal/GU      Musculoskeletal   Abdominal   Peds  Hematology   Anesthesia Other Findings   Reproductive/Obstetrics                             Anesthesia Physical Anesthesia Plan  ASA: II  Anesthesia Plan: General   Post-op Pain Management:    Induction: Intravenous  Airway Management Planned: LMA  Additional Equipment:   Intra-op Plan:   Post-operative Plan: Extubation in OR  Informed Consent: I have reviewed the patients History and Physical, chart, labs and discussed the procedure including the risks, benefits and alternatives for the proposed anesthesia with the patient or authorized representative who has indicated his/her understanding and acceptance.     Plan Discussed with:   Anesthesia Plan Comments:         Anesthesia Quick Evaluation

## 2016-07-16 NOTE — Op Note (Signed)
Patient:  Dennis Dyer  DOB:  [birth year]  MRN:  [id number]   Preop Diagnosis:  Prolapsing hemorrhoidal disease  Postop Diagnosis:  Same  Procedure:  Extensive hemorrhoidectomy  Surgeon:  Franky MachoMark Tyniah Kastens, M.D.  Anes:  Gen.  Indications:  Patient is a 31 year old black male who has had a long-standing history of prolapsing hemorrhoidal disease. The risks and benefits of the procedure including bleeding, infection, and recurrence of the hemorrhoidal disease were fully explained to the patient, who gave informed consent.  Procedure note:  The patient was placed in the lithotomy position. After general anesthesia was a minister, the perineum was prepped and draped using usual sterile technique with Betadine. Surgical site confirmation was performed.  On rectal examination, the patient had prolapsing internal and external hemorrhoidal disease at the 2:00 and 5:00 positions. On the right lateral side, he had internal hemorrhoidal disease that was not as prominent. The prolapsing hemorrhoids were excised in a columnlike fashion using the LigaSure. Care was taken to avoid the external sphincter mechanism. This was somewhat lax in nature. The hemorrhoids were sent to pathology for further examination. No bleeding was noted at the end of the procedure. 0.5% Sensorcaine was instilled into the surrounding wound. Surgicel and viscous Xylocaine rectal packing was placed.  All tape and needle counts were correct at the end the procedure. The patient was awakened and transferred to PACU in stable condition.  Complications:  None  EBL:  Minimal  Specimen:  Hemorrhoids

## 2016-07-16 NOTE — Anesthesia Postprocedure Evaluation (Signed)
Anesthesia Post Note  Patient: Dennis Dyer  Procedure(s) Performed: Procedure(s) (LRB): EXTENSIVE HEMORRHOIDECTOMY (N/A)  Patient location during evaluation: PACU Anesthesia Type: General Level of consciousness: awake and alert and oriented Pain management: pain level controlled Vital Signs Assessment: post-procedure vital signs reviewed and stable Respiratory status: spontaneous breathing Cardiovascular status: stable Postop Assessment: no signs of nausea or vomiting Anesthetic complications: no     Last Vitals:  Vitals:   07/16/16 1200 07/16/16 1258  BP:  (!) 133/92  Pulse:  80  Resp: 11 15  Temp:  36.6 C    Last Pain:  Vitals:   07/16/16 1343  TempSrc:   PainSc: 10-Worst pain ever                 Mechel Haggard A

## 2016-07-18 ENCOUNTER — Encounter (HOSPITAL_COMMUNITY): Payer: Self-pay | Admitting: General Surgery

## 2016-07-20 ENCOUNTER — Ambulatory Visit: Payer: Self-pay | Admitting: Family Medicine

## 2016-07-26 ENCOUNTER — Ambulatory Visit (INDEPENDENT_AMBULATORY_CARE_PROVIDER_SITE_OTHER): Payer: Self-pay | Admitting: General Surgery

## 2016-07-26 ENCOUNTER — Encounter: Payer: Self-pay | Admitting: General Surgery

## 2016-07-26 VITALS — BP 129/79 | HR 62 | Temp 98.2°F | Resp 18 | Ht 70.0 in | Wt 149.0 lb

## 2016-07-26 DIAGNOSIS — Z09 Encounter for follow-up examination after completed treatment for conditions other than malignant neoplasm: Secondary | ICD-10-CM

## 2016-07-26 MED ORDER — OXYCODONE-ACETAMINOPHEN 7.5-325 MG PO TABS: 1.0000 | ORAL_TABLET | Freq: Four times a day (QID) | ORAL | 0 refills | Status: AC | PRN

## 2016-07-26 NOTE — Progress Notes (Signed)
Subjective:     Dennis Dyer  Status post extensive hemorrhoidectomy. Doing well. Minimal bleeding from rectum when he wipes himself. He is having mild pain with bowel movements. Denies constipation. Objective:    BP 129/79   Pulse 62   Temp 98.2 F (36.8 C)   Resp 18   Ht 5\' 10"  (1.778 m)   Wt 149 lb (67.6 kg)   BMI 21.38 kg/m   General:  alert, cooperative and no distress  Rectal examination reveals minimal swelling. No active bleeding noted. Healing well.     Assessment:    Doing well postoperatively.    Plan:  Percocet prescribed for intermittent rectal pain. May return to work on 07/29/2016. I will see him again in 2 weeks for follow-up.

## 2016-08-09 ENCOUNTER — Encounter: Payer: Self-pay | Admitting: General Surgery

## 2016-08-09 ENCOUNTER — Ambulatory Visit: Payer: Self-pay | Admitting: General Surgery

## 2016-08-09 ENCOUNTER — Ambulatory Visit (INDEPENDENT_AMBULATORY_CARE_PROVIDER_SITE_OTHER): Payer: Self-pay | Admitting: General Surgery

## 2016-08-09 VITALS — BP 117/72 | HR 90 | Temp 100.4°F | Resp 18 | Ht 70.0 in | Wt 146.0 lb

## 2016-08-09 DIAGNOSIS — Z09 Encounter for follow-up examination after completed treatment for conditions other than malignant neoplasm: Secondary | ICD-10-CM

## 2016-08-09 NOTE — Progress Notes (Signed)
Subjective:     Dennis Dyer  Continues to recover from extensive hemorrhoidectomy. Is having some serous drainage at hemorrhoidectomy site, but no incontinence noted. Objective:    BP 117/72   Pulse 90   Temp (!) 100.4 F (38 C)   Resp 18   Ht 5\' 10"  (1.778 m)   Wt 146 lb (66.2 kg)   BMI 20.95 kg/m   General:  alert, cooperative and no distress  Rectal examination reveals a healing hemorrhoidectomy site along the left lateral aspect of the anus. No induration or purulent drainage is noted. No blood per rectum is noted.     Assessment:    Doing well postoperatively.    Plan:  Reassured patient that this should heal on its own. He was instructed to follow-up with me should any problems arise. Follow-up as needed. May soak in tub as needed.

## 2018-03-14 ENCOUNTER — Emergency Department (HOSPITAL_COMMUNITY)
Admission: EM | Admit: 2018-03-14 | Discharge: 2018-03-14 | Disposition: A | Discharge status: Home / Self Care | Payer: No Typology Code available for payment source | Attending: Emergency Medicine | Admitting: Emergency Medicine

## 2018-03-14 ENCOUNTER — Encounter (HOSPITAL_COMMUNITY): Payer: Self-pay

## 2018-03-14 DIAGNOSIS — Y999 Unspecified external cause status: Secondary | ICD-10-CM | POA: Diagnosis not present

## 2018-03-14 DIAGNOSIS — Y9389 Activity, other specified: Secondary | ICD-10-CM | POA: Diagnosis not present

## 2018-03-14 DIAGNOSIS — F17210 Nicotine dependence, cigarettes, uncomplicated: Secondary | ICD-10-CM | POA: Insufficient documentation

## 2018-03-14 DIAGNOSIS — M791 Myalgia, unspecified site: Secondary | ICD-10-CM | POA: Insufficient documentation

## 2018-03-14 DIAGNOSIS — J45909 Unspecified asthma, uncomplicated: Secondary | ICD-10-CM | POA: Diagnosis not present

## 2018-03-14 DIAGNOSIS — Y92410 Unspecified street and highway as the place of occurrence of the external cause: Secondary | ICD-10-CM | POA: Diagnosis not present

## 2018-03-14 DIAGNOSIS — S199XXA Unspecified injury of neck, initial encounter: Secondary | ICD-10-CM | POA: Diagnosis present

## 2018-03-14 DIAGNOSIS — S161XXA Strain of muscle, fascia and tendon at neck level, initial encounter: Secondary | ICD-10-CM

## 2018-03-14 DIAGNOSIS — M7918 Myalgia, other site: Secondary | ICD-10-CM

## 2018-03-14 MED ORDER — IBUPROFEN 400 MG PO TABS
400.0000 mg | ORAL_TABLET | Freq: Once | ORAL | Status: AC
  Administered 2018-03-14: 400 mg via ORAL
  Filled 2018-03-14: qty 1

## 2018-03-14 MED ORDER — CYCLOBENZAPRINE HCL 10 MG PO TABS: 10.0000 mg | ORAL_TABLET | Freq: Two times a day (BID) | ORAL | 0 refills | Status: DC | PRN

## 2018-03-14 MED ORDER — CYCLOBENZAPRINE HCL 10 MG PO TABS
10.0000 mg | ORAL_TABLET | Freq: Once | ORAL | Status: AC
  Administered 2018-03-14: 10 mg via ORAL
  Filled 2018-03-14: qty 1
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
## 2018-09-05 ENCOUNTER — Other Ambulatory Visit: Payer: Self-pay

## 2018-09-05 ENCOUNTER — Emergency Department (HOSPITAL_COMMUNITY)
Admission: EM | Admit: 2018-09-05 | Discharge: 2018-09-05 | Disposition: A | Discharge status: Home / Self Care | Payer: HRSA Program | Attending: Emergency Medicine | Admitting: Emergency Medicine

## 2018-09-05 ENCOUNTER — Encounter (HOSPITAL_COMMUNITY): Payer: Self-pay | Admitting: Emergency Medicine

## 2018-09-05 DIAGNOSIS — J45909 Unspecified asthma, uncomplicated: Secondary | ICD-10-CM | POA: Insufficient documentation

## 2018-09-05 DIAGNOSIS — F17210 Nicotine dependence, cigarettes, uncomplicated: Secondary | ICD-10-CM | POA: Diagnosis not present

## 2018-09-05 DIAGNOSIS — Z20828 Contact with and (suspected) exposure to other viral communicable diseases: Secondary | ICD-10-CM | POA: Insufficient documentation

## 2018-09-05 DIAGNOSIS — Z20822 Contact with and (suspected) exposure to covid-19: Secondary | ICD-10-CM

## 2018-09-05 MED ORDER — ALBUTEROL SULFATE HFA 108 (90 BASE) MCG/ACT IN AERS
2.0000 | INHALATION_SPRAY | Freq: Once | RESPIRATORY_TRACT | Status: AC
  Administered 2018-09-05: 2 via RESPIRATORY_TRACT
  Filled 2018-09-05: qty 6.7

## 2018-09-05 NOTE — ED Provider Notes (Addendum)
[facility] Provider Note   CSN: [id number] Arrival date & time: 09/05/18  1705    History   Chief Complaint Chief Complaint  Patient presents with  . Exposure    HPI Ivory X Ferrie is a 33 y.o. male.     Edenilson X Deblasi is a 33 y.o. male with a history of asthma, allergies and heart murmur, who presents for evaluation after COVID exposure at work.  Patient reports 2 days ago he was in close contact with a coworker and also used his telephone.  He found out today that the patient tested positive for coronavirus.  Patient is not currently experiencing any symptoms, he denies fevers, chills, cough, chest pain or shortness of breath.  Patient does report he has asthma, does not feel that this is flaring currently but is out of his inhaler and is concerned that he may need this given that he knows he has been exposed.  Aside from his direct family members at home and coworkers at work he denies any other people he is potentially been exposed to.     Past Medical History:  Diagnosis Date  . Allergy   . Asthma   . Heart murmur     Patient Active Problem List   Diagnosis Date Noted  . Prolapsed hemorrhoids   . Tobacco abuse 07/11/2016  . Internal hemorrhoids 07/11/2016  . History of asthma 07/11/2016  . Eczema 07/11/2016  . Food allergy 07/11/2016    Past Surgical History:  Procedure Laterality Date  . COLONOSCOPY    . HEMORRHOID SURGERY N/A 07/16/2016   Procedure: EXTENSIVE HEMORRHOIDECTOMY;  Surgeon: Franky MachoJenkins, Mark, MD;  Location: [facility];  Service: General;  Laterality: N/A;        Home Medications    Prior to Admission medications   Not on File    Family History Family History  Problem Relation Age of Onset  . Hypertension Mother   . Alcohol abuse Father     Social History Social History   Tobacco Use  . Smoking status: Current Every Day Smoker    Packs/day: 0.50    Years: 10.00    Pack years: 5.00    Types: Cigarettes   Start date: 03/12/2008  . Smokeless tobacco: Never Used  Substance Use Topics  . Alcohol use: Yes    Comment: occasional  . Drug use: Yes    Types: Marijuana    Comment: occasional     Allergies   Banana and Tomato   Review of Systems Review of Systems  Constitutional: Negative for chills and fever.  HENT: Negative.   Respiratory: Negative for cough and shortness of breath.   Cardiovascular: Negative for chest pain.  Gastrointestinal: Negative for diarrhea, nausea and vomiting.  Musculoskeletal: Negative for myalgias.  Neurological: Negative for headaches.     Physical Exam Updated Vital Signs BP 135/80 (BP Location: Right Arm)   Pulse 68   Temp 98.3 F (36.8 C) (Oral)   Resp 16   Ht 5\' 10"  (1.778 m)   Wt 61.2 kg   SpO2 100%   BMI 19.37 kg/m   Physical Exam Vitals signs and nursing note reviewed.  Constitutional:      General: He is not in acute distress.    Appearance: Normal appearance. He is well-developed and normal weight. He is not diaphoretic.  HENT:     Head: Normocephalic and atraumatic.     Mouth/Throat:     Mouth: Mucous membranes are moist.  Pharynx: Oropharynx is clear.  Eyes:     General:        Right eye: No discharge.        Left eye: No discharge.  Pulmonary:     Effort: Pulmonary effort is normal. No respiratory distress.     Comments: Respirations equal and unlabored, patient able to speak in full sentences, lungs clear to auscultation bilaterally Skin:    General: Skin is warm and dry.  Neurological:     Mental Status: He is alert and oriented to person, place, and time.     Coordination: Coordination normal.  Psychiatric:        Mood and Affect: Mood normal.        Behavior: Behavior normal.      ED Treatments / Results  Labs (all labs ordered are listed, but only abnormal results are displayed) Labs Reviewed  NOVEL CORONAVIRUS, NAA (HOSPITAL ORDER, SEND-OUT TO REF LAB)    EKG None  Radiology No results found.   Procedures Procedures (including critical care time)  Medications Ordered in ED Medications  albuterol (VENTOLIN HFA) 108 (90 Base) MCG/ACT inhaler 2 puff (has no administration in time range)     Initial Impression / Assessment and Plan / ED Course  I have reviewed the triage vital signs and the nursing notes.  Pertinent labs & imaging results that were available during my care of the patient were reviewed by me and considered in my medical decision making (see chart for details).  Patient presents requesting testing after coronavirus exposure from coworker.  He is currently asymptomatic and vitals are normal.  Patient is well-appearing.  He does have history of asthma, is not currently experiencing exacerbation but reports he is out of his inhaler, he is concerned given he has been exposed that he may need one.  Patient was provided with inhaler.  48-hour COVID test sent, patient has been given work excuse and instructed to quarantine at home until test results return.  Return precautions discussed.  Patient expresses understanding and agreement with plan.  Discharged home in good condition.  Dorrance X Rainford was evaluated in Emergency Department on 09/05/2018 for the symptoms described in the history of present illness. He was evaluated in the context of the global COVID-19 pandemic, which necessitated consideration that the patient might be at risk for infection with the SARS-CoV-2 virus that causes COVID-19. Institutional protocols and algorithms that pertain to the evaluation of patients at risk for COVID-19 are in a state of rapid change based on information released by regulatory bodies including the CDC and federal and state organizations. These policies and algorithms were followed during the patient's care in the ED.   Final Clinical Impressions(s) / ED Diagnoses   Final diagnoses:  Close Exposure to Covid-19 Virus    ED Discharge Orders    None       Jacqlyn Larsen, PA-C  09/05/18 1831    Jacqlyn Larsen, PA-C 09/05/18 [place], [place], DO 09/09/18 [phone]
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
## 2018-09-05 NOTE — Discharge Instructions (Signed)
You have been tested for the COVID-19 virus, result should return in about 2 days and you will be called by phone or can check results through [facility].  Please continue to quarantine at home until your test results return. Antibiotics are not helpful in treating viral infection, if you do develop symptoms the virus should run its course in about 7-10 days. Please make sure you are drinking plenty of fluids. You can treat your symptoms supportively with tylenol for fevers and pains, and over the counter cough syrups and throat lozenges to help with cough. If your symptoms are not improving please follow up with you Primary doctor.   If you develop persistent fevers, shortness of breath or difficulty breathing, chest pain, severe headache and neck pain, persistent nausea and vomiting or other new or concerning symptoms return to the Emergency department.

## 2018-09-06 LAB — NOVEL CORONAVIRUS, NAA (HOSP ORDER, SEND-OUT TO REF LAB; TAT 18-24 HRS): SARS-CoV-2, NAA: NOT DETECTED

## 2018-11-28 ENCOUNTER — Ambulatory Visit: Payer: Self-pay | Admitting: Orthopedic Surgery

## 2018-12-08 ENCOUNTER — Encounter: Payer: Self-pay | Admitting: Orthopedic Surgery

## 2018-12-08 ENCOUNTER — Ambulatory Visit: Payer: Self-pay | Admitting: Orthopedic Surgery

## 2019-03-31 ENCOUNTER — Ambulatory Visit: Payer: MEDICAID | Admitting: General Surgery

## 2019-04-07 ENCOUNTER — Ambulatory Visit: Payer: Self-pay | Admitting: General Surgery

## 2019-04-14 ENCOUNTER — Ambulatory Visit: Payer: Self-pay | Attending: Internal Medicine

## 2019-04-14 ENCOUNTER — Other Ambulatory Visit: Payer: Self-pay

## 2019-04-14 DIAGNOSIS — Z20822 Contact with and (suspected) exposure to covid-19: Secondary | ICD-10-CM | POA: Insufficient documentation

## 2019-04-15 LAB — NOVEL CORONAVIRUS, NAA: SARS-CoV-2, NAA: NOT DETECTED

## 2019-10-12 ENCOUNTER — Emergency Department (HOSPITAL_COMMUNITY): Payer: BC Managed Care – PPO

## 2019-10-12 ENCOUNTER — Emergency Department (HOSPITAL_COMMUNITY)
Admission: EM | Admit: 2019-10-12 | Discharge: 2019-10-12 | Disposition: A | Discharge status: Home / Self Care | Payer: BC Managed Care – PPO | Attending: Emergency Medicine | Admitting: Emergency Medicine

## 2019-10-12 ENCOUNTER — Other Ambulatory Visit: Payer: Self-pay

## 2019-10-12 ENCOUNTER — Encounter (HOSPITAL_COMMUNITY): Payer: Self-pay | Admitting: *Deleted

## 2019-10-12 DIAGNOSIS — M533 Sacrococcygeal disorders, not elsewhere classified: Secondary | ICD-10-CM | POA: Insufficient documentation

## 2019-10-12 DIAGNOSIS — M79606 Pain in leg, unspecified: Secondary | ICD-10-CM | POA: Diagnosis present

## 2019-10-12 DIAGNOSIS — F17210 Nicotine dependence, cigarettes, uncomplicated: Secondary | ICD-10-CM | POA: Diagnosis not present

## 2019-10-12 DIAGNOSIS — J45909 Unspecified asthma, uncomplicated: Secondary | ICD-10-CM | POA: Insufficient documentation

## 2019-10-12 MED ORDER — IBUPROFEN 600 MG PO TABS: 600.0000 mg | ORAL_TABLET | Freq: Four times a day (QID) | ORAL | 0 refills | Status: DC | PRN

## 2019-10-12 NOTE — ED Triage Notes (Signed)
C/o pain in lower back and tailbone region for 4 days, c/o swelling in area

## 2019-10-12 NOTE — Discharge Instructions (Addendum)
Your x-rays are negative for any acute injury or obvious source of your pain symptoms.  Sometimes you can develop pain at the tailbone site which is secondary to inflammation of the cartilage.  I recommend high-dose ibuprofen to see if this will help relieve your symptoms..  This is also a side effect that can develop infection and even an abscess but there is no evidence for this today.  However if you develop increasing swelling or redness at the site you should get rechecked for this possibility.

## 2019-10-12 NOTE — ED Notes (Signed)
Pt c/o "swelling" to lower back upper tailbone region for the past few days, denies any injury, denies any drainage,

## 2019-10-12 NOTE — ED Triage Notes (Signed)
Also is concerned about bilateral leg pain for 3 weeks, states he may have clogged arteries from smoking

## 2019-10-14 NOTE — ED Provider Notes (Signed)
[facility] EMERGENCY DEPARTMENT Provider Note   CSN: [id number] Arrival date & time: 10/12/19  1120     History Chief Complaint  Patient presents with  . Tailbone Pain  . Back Pain    Dennis Dyer is a 34 y.o. male with a 4 day history of tailbone pain.  He denies fall or prior injury to the site.  He perceives swelling at the site, reports pain is worsened with sitting.  Describes falling onto his bed yesterday and had severe pain at the site. He has had no treatment prior to arrival.  He also reports intermittent fleeting stabs of pain in his legs which he notices most when standing at work, currently sx free.  Denies numbness, weakness, no rash, no swelling, denies injury. States he is a smoker and is concerned about clogged arteries.   HPI     Past Medical History:  Diagnosis Date  . Allergy   . Asthma   . Heart murmur     Patient Active Problem List   Diagnosis Date Noted  . Prolapsed hemorrhoids   . Tobacco abuse 07/11/2016  . Internal hemorrhoids 07/11/2016  . History of asthma 07/11/2016  . Eczema 07/11/2016  . Food allergy 07/11/2016    Past Surgical History:  Procedure Laterality Date  . COLONOSCOPY    . HEMORRHOID SURGERY N/A 07/16/2016   Procedure: EXTENSIVE HEMORRHOIDECTOMY;  Surgeon: Franky Macho, MD;  Location: [facility];  Service: General;  Laterality: N/A;       Family History  Problem Relation Age of Onset  . Hypertension Mother   . Alcohol abuse Father     Social History   Tobacco Use  . Smoking status: Current Every Day Smoker    Packs/day: 0.50    Years: 10.00    Pack years: 5.00    Types: Cigarettes    Start date: 03/12/2008  . Smokeless tobacco: Never Used  Vaping Use  . Vaping Use: Never used  Substance Use Topics  . Alcohol use: Yes    Comment: occasional  . Drug use: Yes    Types: Marijuana    Comment: occasional    Home Medications Prior to Admission medications   Medication Sig Start Date End Date Taking?  Authorizing Provider  ibuprofen (ADVIL) 600 MG tablet Take 1 tablet (600 mg total) by mouth every 6 (six) hours as needed. 10/12/19   Burgess Amor, PA-C    Allergies    Banana and Tomato  Review of Systems   Review of Systems  Constitutional: Negative for chills and fever.  Musculoskeletal: Positive for arthralgias. Negative for gait problem, joint swelling and myalgias.  Neurological: Negative for weakness and numbness.  All other systems reviewed and are negative.   Physical Exam Updated Vital Signs BP 127/90   Pulse 60   Temp 97.9 F (36.6 C) (Oral)   Resp 16   Ht 5\' 11"  (1.803 m)   Wt 65.8 kg   SpO2 98%   BMI 20.22 kg/m   Physical Exam Constitutional:      Appearance: He is well-developed.  HENT:     Head: Atraumatic.  Cardiovascular:     Rate and Rhythm: Normal rate.     Pulses: Normal pulses.          Dorsalis pedis pulses are 2+ on the right side and 2+ on the left side.     Comments: Pulses equal bilaterally Musculoskeletal:        General: Tenderness present.  Cervical back: Normal range of motion.     Right lower leg: No edema.     Left lower leg: No edema.     Comments: Pt is point tender at the coccyx. No skin breakdown, no erythema, induration, or edema or lesions. Skin appears normal.   Skin:    General: Skin is warm and dry.  Neurological:     Mental Status: He is alert.     Sensory: No sensory deficit.     Deep Tendon Reflexes: Reflexes normal.     ED Results / Procedures / Treatments   Labs (all labs ordered are listed, but only abnormal results are displayed) Labs Reviewed - No data to display  EKG None  Radiology  DG Lumbar Spine Complete  Result Date: 10/12/2019 CLINICAL DATA:  Low back pain for 4 days.  No known injury. EXAM: LUMBAR SPINE - COMPLETE 4+ VIEW COMPARISON:  None. FINDINGS: Normal alignment of the lumbar vertebral bodies. Disc spaces and vertebral bodies are maintained. The facets are normally aligned. No pars defects.  The visualized bony pelvis is intact. IMPRESSION: Normal alignment and no acute bony findings or degenerative changes. Electronically Signed   By: Rudie Meyer M.D.   On: 10/12/2019 16:05   DG Sacrum/Coccyx  Result Date: 10/12/2019 CLINICAL DATA:  Sacral pain.  No known injury. EXAM: SACRUM AND COCCYX - 2+ VIEW COMPARISON:  None. FINDINGS: The sacrum appears intact. The SI joints are normal. Both hips are normally located. IMPRESSION: No acute bony findings. Electronically Signed   By: Rudie Meyer M.D.   On: 10/12/2019 16:08     Procedures Procedures (including critical care time)  Medications Ordered in ED Medications - No data to display  ED Course  I have reviewed the triage vital signs and the nursing notes.  Pertinent labs & imaging results that were available during my care of the patient were reviewed by me and considered in my medical decision making (see chart for details).    MDM Rules/Calculators/A&P                          Imaging reviewed and discussed with pt. Coccyx pain without fracture, no signs of abscess.  Return precautions discussed including swelling, redness, drainage.  Pt has normal lower extremity exam with no edema and full pedal pulses.  Exam and hx not suggestive of claudication. No edema, dvt not suspected, no rash, no joint effusions.   Ibuprofen recommended. Referral to ortho prn.  Final Clinical Impression(s) / ED Diagnoses Final diagnoses:  Coccyxdynia    Rx / DC Orders ED Discharge Orders         Ordered    ibuprofen (ADVIL) 600 MG tablet  Every 6 hours PRN     Discontinue  Reprint     10/12/19 1644           Victoriano Lain 10/14/19 2117    Terrilee Files, MD 10/15/19 1018

## 2020-06-21 ENCOUNTER — Ambulatory Visit: Payer: Self-pay | Admitting: General Surgery

## 2020-07-05 ENCOUNTER — Ambulatory Visit: Payer: Self-pay | Admitting: General Surgery

## 2020-08-23 ENCOUNTER — Ambulatory Visit: Payer: Self-pay | Admitting: General Surgery

## 2020-09-15 ENCOUNTER — Ambulatory Visit: Payer: Self-pay | Admitting: General Surgery

## 2020-09-22 ENCOUNTER — Other Ambulatory Visit: Payer: Self-pay

## 2020-09-22 ENCOUNTER — Encounter: Payer: Self-pay | Admitting: General Surgery

## 2020-09-22 ENCOUNTER — Ambulatory Visit: Payer: Self-pay | Admitting: General Surgery

## 2020-09-22 VITALS — BP 117/76 | HR 67 | Temp 98.5°F | Resp 12 | Ht 71.0 in | Wt 146.0 lb

## 2020-09-22 DIAGNOSIS — K602 Anal fissure, unspecified: Secondary | ICD-10-CM

## 2020-09-22 NOTE — Progress Notes (Signed)
Dennis Dyer; [id number]; 08-22-1985   HPI Patient is a 35 year old black male who returns to my care for rectal pain.  He is status post extensive hemorrhoidectomy in the past.  He thinks his hemorrhoid disease is back.  He does have a burning sensation after he has a bowel movement.  The pain radiates to his tailbone.  He does not notice any blood in the toilet bowl when he wipes out himself.  He has tried multiple creams and suppositories without success.  This has been occurring over the past year. Past Medical History:  Diagnosis Date   Allergy    Asthma    Heart murmur     Past Surgical History:  Procedure Laterality Date   COLONOSCOPY     HEMORRHOID SURGERY N/A 07/16/2016   Procedure: EXTENSIVE HEMORRHOIDECTOMY;  Surgeon: Franky Macho, MD;  Location: [facility];  Service: General;  Laterality: N/A;    Family History  Problem Relation Age of Onset   Hypertension Mother    Alcohol abuse Father     No current outpatient medications on file prior to visit.   No current facility-administered medications on file prior to visit.    Allergies  Allergen Reactions   Banana Anaphylaxis   Tomato Anaphylaxis    Social History   Substance and Sexual Activity  Alcohol Use Yes   Comment: occasional    Social History   Tobacco Use  Smoking Status Every Day   Packs/day: 0.50   Years: 10.00   Pack years: 5.00   Types: Cigarettes   Start date: 03/12/2008  Smokeless Tobacco Never    Review of Systems  Constitutional: Negative.   HENT: Negative.    Eyes: Negative.   Respiratory: Negative.    Cardiovascular: Negative.   Gastrointestinal: Negative.   Genitourinary: Negative.   Musculoskeletal: Negative.   Skin: Negative.   Neurological: Negative.   Endo/Heme/Allergies: Negative.   Psychiatric/Behavioral: Negative.     Objective   Vitals:   09/22/20 1003  BP: 117/76  Pulse: 67  Resp: 12  Temp: 98.5 F (36.9 C)  SpO2: 97%    Physical Exam Vitals reviewed.   Constitutional:      Appearance: Normal appearance. He is normal weight. He is not ill-appearing.  HENT:     Head: Normocephalic and atraumatic.  Cardiovascular:     Rate and Rhythm: Normal rate and regular rhythm.     Heart sounds: Normal heart sounds. No murmur heard.   No friction rub. No gallop.  Pulmonary:     Effort: Pulmonary effort is normal. No respiratory distress.     Breath sounds: Normal breath sounds. No stridor. No wheezing, rhonchi or rales.  Genitourinary:    Comments: Rectal examination reveals no external hemorrhoids present.  A posterior anal fissure is present.  No active bleeding noted.  Examination somewhat limited secondary to pain. Skin:    General: Skin is warm and dry.  Neurological:     Mental Status: He is alert and oriented to person, place, and time.    Assessment  Anal fissure Plan  Patient was told to continue the creams.  Rectiv cream was also given.  He was given instructions on how to move his bowels without straining.  Literature was given.  Follow-up here in 2 months.  No need for acute surgical invention at this time.

## 2020-11-08 ENCOUNTER — Ambulatory Visit: Payer: Self-pay | Admitting: General Surgery

## 2020-11-29 ENCOUNTER — Ambulatory Visit: Payer: Self-pay | Admitting: General Surgery

## 2020-12-20 ENCOUNTER — Ambulatory Visit: Payer: Self-pay | Admitting: General Surgery

## 2021-01-03 ENCOUNTER — Ambulatory Visit: Payer: Self-pay | Admitting: General Surgery

## 2021-05-21 ENCOUNTER — Encounter (HOSPITAL_COMMUNITY): Payer: Self-pay | Admitting: Emergency Medicine

## 2021-05-21 ENCOUNTER — Other Ambulatory Visit: Payer: Self-pay

## 2021-05-21 ENCOUNTER — Emergency Department (HOSPITAL_COMMUNITY)
Admission: EM | Admit: 2021-05-21 | Discharge: 2021-05-21 | Disposition: A | Discharge status: Home / Self Care | Payer: Self-pay | Attending: Emergency Medicine | Admitting: Emergency Medicine

## 2021-05-21 ENCOUNTER — Emergency Department (HOSPITAL_COMMUNITY): Payer: Self-pay

## 2021-05-21 DIAGNOSIS — M791 Myalgia, unspecified site: Secondary | ICD-10-CM | POA: Insufficient documentation

## 2021-05-21 DIAGNOSIS — R519 Headache, unspecified: Secondary | ICD-10-CM | POA: Insufficient documentation

## 2021-05-21 DIAGNOSIS — J3489 Other specified disorders of nose and nasal sinuses: Secondary | ICD-10-CM | POA: Insufficient documentation

## 2021-05-21 DIAGNOSIS — R509 Fever, unspecified: Secondary | ICD-10-CM | POA: Insufficient documentation

## 2021-05-21 DIAGNOSIS — J189 Pneumonia, unspecified organism: Secondary | ICD-10-CM

## 2021-05-21 DIAGNOSIS — R062 Wheezing: Secondary | ICD-10-CM | POA: Insufficient documentation

## 2021-05-21 DIAGNOSIS — Z20822 Contact with and (suspected) exposure to covid-19: Secondary | ICD-10-CM | POA: Insufficient documentation

## 2021-05-21 DIAGNOSIS — R197 Diarrhea, unspecified: Secondary | ICD-10-CM | POA: Insufficient documentation

## 2021-05-21 DIAGNOSIS — R059 Cough, unspecified: Secondary | ICD-10-CM | POA: Insufficient documentation

## 2021-05-21 DIAGNOSIS — Z720 Tobacco use: Secondary | ICD-10-CM

## 2021-05-21 LAB — BASIC METABOLIC PANEL
Anion gap: 12 (ref 5–15)
BUN: 16 mg/dL (ref 6–20)
CO2: 21 mmol/L — ABNORMAL LOW (ref 22–32)
Calcium: 8.6 mg/dL — ABNORMAL LOW (ref 8.9–10.3)
Chloride: 95 mmol/L — ABNORMAL LOW (ref 98–111)
Creatinine, Ser: 1.29 mg/dL — ABNORMAL HIGH (ref 0.61–1.24)
GFR, Estimated: >60 mL/min (ref >60)
Glucose, Bld: 124 mg/dL — ABNORMAL HIGH (ref 70–99)
Potassium: 3.2 mmol/L — ABNORMAL LOW (ref 3.5–5.1)
Sodium: 128 mmol/L — ABNORMAL LOW (ref 135–145)

## 2021-05-21 LAB — CBC WITH DIFFERENTIAL/PLATELET
Abs Immature Granulocytes: 0.11 10*3/uL — ABNORMAL HIGH (ref 0.00–0.07)
Basophils Absolute: 0 10*3/uL (ref 0.0–0.1)
Basophils Relative: 0 %
Eosinophils Absolute: 0.1 10*3/uL (ref 0.0–0.5)
Eosinophils Relative: 1 %
HCT: 38.3 % — ABNORMAL LOW (ref 39.0–52.0)
Hemoglobin: 13.6 g/dL (ref 13.0–17.0)
Immature Granulocytes: 1 %
Lymphocytes Relative: 14 %
Lymphs Abs: 1.8 10*3/uL (ref 0.7–4.0)
MCH: 33.8 pg (ref 26.0–34.0)
MCHC: 35.5 g/dL (ref 30.0–36.0)
MCV: 95.3 fL (ref 80.0–100.0)
Monocytes Absolute: 1 10*3/uL (ref 0.1–1.0)
Monocytes Relative: 8 %
Neutro Abs: 9.5 10*3/uL — ABNORMAL HIGH (ref 1.7–7.7)
Neutrophils Relative %: 76 %
Platelets: 185 10*3/uL (ref 150–400)
RBC: 4.02 MIL/uL — ABNORMAL LOW (ref 4.22–5.81)
RDW: 12.6 % (ref 11.5–15.5)
WBC: 12.5 10*3/uL — ABNORMAL HIGH (ref 4.0–10.5)
nRBC: 0 % (ref 0.0–0.2)

## 2021-05-21 LAB — RESP PANEL BY RT-PCR (FLU A&B, COVID) ARPGX2
Influenza A by PCR: NEGATIVE
Influenza B by PCR: NEGATIVE
SARS Coronavirus 2 by RT PCR: NEGATIVE

## 2021-05-21 MED ORDER — METHYLPREDNISOLONE 4 MG PO TBPK: ORAL_TABLET | ORAL | 0 refills | Status: AC

## 2021-05-21 MED ORDER — DOXYCYCLINE HYCLATE 100 MG PO TABS
100.0000 mg | ORAL_TABLET | Freq: Once | ORAL | Status: AC
  Administered 2021-05-21: 100 mg via ORAL
  Filled 2021-05-21: qty 1

## 2021-05-21 MED ORDER — PREDNISONE 50 MG PO TABS
60.0000 mg | ORAL_TABLET | Freq: Once | ORAL | Status: AC
  Administered 2021-05-21: 60 mg via ORAL
  Filled 2021-05-21: qty 1

## 2021-05-21 MED ORDER — SODIUM CHLORIDE 0.9 % IV SOLN
1.0000 g | Freq: Once | INTRAVENOUS | Status: AC
  Administered 2021-05-21: 1 g via INTRAVENOUS
  Filled 2021-05-21: qty 10

## 2021-05-21 MED ORDER — ACETAMINOPHEN 325 MG PO TABS
650.0000 mg | ORAL_TABLET | Freq: Once | ORAL | Status: AC
  Administered 2021-05-21: 650 mg via ORAL
  Filled 2021-05-21: qty 2

## 2021-05-21 MED ORDER — CEFTRIAXONE SODIUM 1 G IJ SOLR
1.0000 g | Freq: Once | INTRAMUSCULAR | Status: DC
  Filled 2021-05-21: qty 10

## 2021-05-21 MED ORDER — IPRATROPIUM-ALBUTEROL 0.5-2.5 (3) MG/3ML IN SOLN
3.0000 mL | Freq: Once | RESPIRATORY_TRACT | Status: AC
  Administered 2021-05-21: 3 mL via RESPIRATORY_TRACT
  Filled 2021-05-21: qty 3

## 2021-05-21 MED ORDER — DOXYCYCLINE HYCLATE 100 MG PO CAPS: 100.0000 mg | ORAL_CAPSULE | Freq: Two times a day (BID) | ORAL | 0 refills | Status: AC

## 2021-05-21 NOTE — Discharge Instructions (Addendum)
Contact a health care provider if you have: A fever. Trouble sleeping because you cannot control your cough with cough medicine. Get help right away if: Your shortness of breath becomes worse. Your chest pain increases. Your sickness becomes worse, especially if you are an older adult or have a weak immune system. You cough up blood. 

## 2021-05-21 NOTE — ED Provider Notes (Incomplete)
[facility] EMERGENCY DEPARTMENT Provider Note   CSN: [id number] Arrival date & time: 05/21/21  1304     History {Add pertinent medical, surgical, social history, OB history to HPI:1} Chief Complaint  Patient presents with   Fever   Generalized Body Aches   Diarrhea    Dennis Dyer is a 36 y.o. male who presents emergency department with 1 week of flulike symptoms.  Patient had onset of fever, chills, body aches, headache, cough productive of clear sputum, wheezing and diarrhea 1 week ago.  He has been using Aleve, TheraFlu, NyQuil and DayQuil with only minimal relief of his symptoms.  He has had increased use of his inhaler twice a day.  He denies vomiting.  He has no contacts with similar symptoms.   Fever Associated symptoms: diarrhea   Diarrhea Associated symptoms: fever       Home Medications Prior to Admission medications   Not on File      Allergies    Banana and Tomato    Review of Systems   Review of Systems  Constitutional:  Positive for fever.  Gastrointestinal:  Positive for diarrhea.   Physical Exam Updated Vital Signs BP 126/74    Pulse 97    Temp (!) 101.8 F (38.8 C) (Oral)    Resp 20    Ht 5\' 11"  (1.803 m)    Wt 63.5 kg    SpO2 99%    BMI 19.53 kg/m  Physical Exam Vitals and nursing note reviewed.  Constitutional:      General: He is not in acute distress.    Appearance: He is well-developed. He is not diaphoretic.  HENT:     Head: Normocephalic and atraumatic.  Eyes:     General: No scleral icterus.    Conjunctiva/sclera: Conjunctivae normal.  Cardiovascular:     Rate and Rhythm: Normal rate and regular rhythm.     Heart sounds: Normal heart sounds.  Pulmonary:     Effort: Pulmonary effort is normal. No respiratory distress.     Breath sounds: Wheezing and rhonchi present.  Abdominal:     Palpations: Abdomen is soft.     Tenderness: There is no abdominal tenderness.  Musculoskeletal:     Cervical back: Normal range of motion and  neck supple.  Skin:    General: Skin is warm and dry.  Neurological:     Mental Status: He is alert.  Psychiatric:        Behavior: Behavior normal.    ED Results / Procedures / Treatments   Labs (all labs ordered are listed, but only abnormal results are displayed) Labs Reviewed  RESP PANEL BY RT-PCR (FLU A&B, COVID) [id number]  BASIC METABOLIC PANEL  CBC WITH DIFFERENTIAL/PLATELET    EKG None  Radiology DG Chest Port 1 View  Result Date: 05/21/2021 CLINICAL DATA:  36 year old male with history of cough. Flu-like symptoms. EXAM: PORTABLE CHEST 1 VIEW COMPARISON:  Chest x-ray 09/28/2015. FINDINGS: New opacity with air bronchograms in the right lower lobe. Left lung is clear. No pleural effusions. No pneumothorax. No evidence of pulmonary edema. Heart size is normal. Upper mediastinal contours are within normal limits. IMPRESSION: 1. Right lower lobe pneumonia. Followup PA and lateral chest X-ray is recommended in 3-4 weeks following trial of antibiotic therapy to ensure resolution and exclude underlying malignancy. Electronically Signed   By: Vinnie Langton M.D.   On: 05/21/2021 13:43    Procedures Procedures  {Document cardiac monitor, telemetry assessment procedure when appropriate:1}  Medications  Ordered in ED Medications  acetaminophen (TYLENOL) tablet 650 mg (has no administration in time range)  predniSONE (DELTASONE) tablet 60 mg (has no administration in time range)  ipratropium-albuterol (DUONEB) 0.5-2.5 (3) MG/3ML nebulizer solution 3 mL (3 mLs Nebulization Given 05/21/21 1344)    ED Course/ Medical Decision Making/ A&P Clinical Course as of 05/21/21 1413  Sun May 21, 2021  1412 DG Chest [place] 1 View I reviewed 1 view chest x-ray which shows a right lower lobe consolidation consistent with community-acquired pneumonia, agreed with radiologic interpretation. [AH]  1413 I have ordered Rocephin IM and oral doxycycline. [AH]    Clinical Course User Index [AH] Margarita Mail, PA-C                           Medical Decision Making Amount and/or Complexity of Data Reviewed Labs: ordered. Radiology: ordered.  Risk OTC drugs. Prescription drug management.   ***  {Document critical care time when appropriate:1} {Document review of labs and clinical decision tools ie heart score, Chads2Vasc2 etc:1}  {Document your independent review of radiology images, and any outside records:1} {Document your discussion with family members, caretakers, and with consultants:1} {Document social determinants of health affecting pt's care:1} {Document your decision making why or why not admission, treatments were needed:1} Final Clinical Impression(s) / ED Diagnoses Final diagnoses:  None    Rx / DC Orders ED Discharge Orders     None

## 2021-05-21 NOTE — ED Triage Notes (Signed)
Pt presents today c/o "flu like symptoms" x 1 week. Reports headaches, fever, diarrhea, body aches, cough. Lats took theraflu around 2 hours ago.  ?

## 2021-06-14 ENCOUNTER — Encounter (HOSPITAL_COMMUNITY): Payer: Self-pay | Admitting: Radiology

## 2021-06-20 ENCOUNTER — Ambulatory Visit: Payer: Self-pay | Admitting: Family Medicine

## 2021-07-26 ENCOUNTER — Ambulatory Visit: Payer: Self-pay | Admitting: Family Medicine

## 2022-10-04 ENCOUNTER — Inpatient Hospital Stay
Admit: 2022-10-04 | Discharge: 2022-10-04 | Disposition: A | Discharge status: Home / Self Care | Attending: Emergency Medicine

## 2022-10-04 ENCOUNTER — Emergency Department: Admit: 2022-10-04

## 2022-10-04 DIAGNOSIS — M7711 Lateral epicondylitis, right elbow: Secondary | ICD-10-CM

## 2022-10-04 MED ORDER — IBUPROFEN 600 MG PO TABS
600 | ORAL_TABLET | Freq: Four times a day (QID) | ORAL | 0 refills | Status: AC | PRN
Start: 2022-10-04 — End: ?

## 2022-10-04 NOTE — ED Notes (Signed)
Received report from Zella Ball, Charity fundraiser.     Pt awake and alert. Ambulated to bathroom without assistance.

## 2022-10-04 NOTE — Discharge Instructions (Signed)
You were seen in the emergency department for elbow pain and had x-rays.  You have tendinitis/lateral epicondylitis also known as "tennis elbow".  Please take your medicines as prescribed.  You can also obtain a tennis elbow armband over-the-counter at a local pharmacy to help alleviate your symptoms.  Please continue to rest, ice, elevate, compress the elbow for further pain control.  Please follow-up with primary care for reevaluation, as you may need physical therapy and/for specialty care referrals in the future.  Please return to the emergency department and/or call your doctor with any new or worsening symptoms including persistent fevers or vomiting, chest pain, trouble breathing, abdominal pain, redness or rashes, worsening pain or swelling to the elbow inability to bend your elbow, or any other concerns.

## 2022-10-04 NOTE — ED Notes (Signed)
Assumed care of pt, pt reports R elbow pain x 3 months, pt has full ROM, sometimes pain shoot down R arm

## 2022-10-04 NOTE — ED Provider Notes (Cosign Needed)
EMERGENCY DEPARTMENT HISTORY AND PHYSICAL EXAM      Date: 10/04/2022  Patient Name: Kyle Frank    History of Presenting Illness     Chief Complaint   Patient presents with    Elbow Pain       History (Context): Ambrose Dona is a 37 y.o. male  presents to the ED today with chief complaint of right elbow pain.  Pain began 3 months ago. Patient works as a Manufacturing engineer. Reports pain x3 months, worse with these activities/prolonged use of his arm.  Denies any other trauma or injury to his elbow.  No swelling or redness.  No fevers or chills.  No other symptoms or concerns.  Has tried Tylenol and icing without relief.      PCP: No primary care provider on file.    No current facility-administered medications for this encounter.     Current Outpatient Medications   Medication Sig Dispense Refill    ibuprofen (ADVIL;MOTRIN) 600 MG tablet Take 1 tablet by mouth 4 times daily as needed for Pain 40 tablet 0       Past History     Past Medical History:   History reviewed. No pertinent past medical history.    Past Surgical History:  History reviewed. No pertinent surgical history.    Family History:  History reviewed. No pertinent family history.    Social History:        Allergies:  No Known Allergies        Review of System   All clinically pertinent and relevant review of systems as above in the HPI    Physical Exam     Vitals:    10/04/22 0541 10/04/22 0617   BP: 115/79    Pulse: 57    Resp: 18    Temp: 98.2 F (36.8 C)    TempSrc: Oral    SpO2: 100%    Weight:  68 kg (150 lb)   Height:  1.78 m (5' 10.08")       Physical Exam  Vitals and nursing note reviewed.   Constitutional:       General: He is not in acute distress.     Appearance: He is not ill-appearing, toxic-appearing or diaphoretic.   HENT:      Head: Normocephalic and atraumatic.   Cardiovascular:      Pulses: Normal pulses.   Pulmonary:      Effort: Pulmonary effort is normal. No respiratory distress.      Breath sounds: Normal breath sounds.    Musculoskeletal:         General: Tenderness present. No swelling or deformity.      Right elbow: No deformity or effusion. Normal range of motion. Tenderness present in lateral epicondyle. No medial epicondyle or olecranon process tenderness.      Left elbow: No deformity or effusion. Normal range of motion.      Cervical back: Neck supple.      Comments: Pain worse with wrist extension and elbow extension   Skin:     General: Skin is warm and dry.      Capillary Refill: Capillary refill takes less than 2 seconds.   Neurological:      Mental Status: He is alert.         Diagnostic Study Results     Labs -   No results found for this or any previous visit (from the past 12 hour(s)). Labs Reviewed - No data to display  Radiologic Studies -   XR ELBOW RIGHT (MIN 3 VIEWS)   Final Result      1. Mild dorsal soft tissue swelling, mild olecranon bursitis or triceps   tendinosis possible. Otherwise negative study.      Electronically signed by Kathe Mariner            The laboratory results, imaging results, and other diagnostic exams were reviewed in the EMR.    Medical Decision Making   I am the first provider for this patient.    In summary, 37 yo otherwise healthy male who presents to the ED with c/o     I reviewed the vital signs, available nursing notes, past medical history, past surgical history, family history and social history.    Vital Signs-Reviewed the patient's vital signs.afebrile, not tachycardic, normotensive  Pertinent Physical Exam-  lateral epicondyle ttp, no limitation in ROM, swelling, erythema         Records Reviewed: Personally, on initial evaluation    MDM:   DDX includes but is not limited to:   Fracture, dislocation, sprain, tendinitis  Not consistent with septic joint or crystal arthropathy - no erythema, swelling, limitation in ROM      Patient's XR on my read shows no acute bony abnormalities.       Will discharge patient home with strict return precautions and follow up recommendations.  Patient verbalized understanding and is without further questions.      Orders as below:  Orders Placed This Encounter   Procedures    XR ELBOW RIGHT (MIN 3 VIEWS)          ED Course:   ED Course as of 10/04/22 0936   Thu Oct 04, 2022   0811 Elbow XR interpreted by me shows no acute bony abnormalities.  [SD]      ED Course User Index  [SD] Hoy Morn, MD         Is this patient to be included in the SEP-1 core measure? No Exclusion criteria - the patient is NOT to be included for SEP-1 Core Measure due to: 2+ SIRS criteria are not met      Procedures:  Procedures      Social Determinants of Health: No PCP; no health insurance       Supplemental Historians include: None       Documentation/Prior Results Review:  Nursing notes.      Discussion of Mangement with other Physicians, QHP or Appropriate Source:  None    Diagnosis and Disposition     CLINICAL IMPRESSION:  1. Lateral epicondylitis of right elbow         Medication List        START taking these medications      ibuprofen 600 MG tablet  Commonly known as: ADVIL;MOTRIN  Take 1 tablet by mouth 4 times daily as needed for Pain               Where to Get Your Medications        Information about where to get these medications is not yet available    Ask your nurse or doctor about these medications  ibuprofen 600 MG tablet         Disposition: Discharge     Patient condition at time of disposition: Stable    DISCHARGE NOTE:   Pt has been reexamined. Patient has no new complaints, changes, or physical findings.  Care plan outlined and precautions discussed.  Results were  reviewed with the patient. All medications were reviewed with the patient. All of pt's questions and concerns were addressed.  Alarm symptoms and return precautions associated with chief complaint and evaluation were reviewed with the patient in detail.  The patient demonstrated adequate understanding.  Patient was instructed to follow up with PCP, as well as given strict return precautions to  the ED upon further deterioration. Patient is ready for discharge home.          Dragon Disclaimer     Please note that this dictation was completed with Dragon, the Advertising account planner.  Quite often unanticipated grammatical, syntax, homophones, and other interpretive errors are inadvertently transcribed by the computer software.  Please disregard these errors.  Please excuse any errors that have escaped final proofreading.      Fredna Dow, MD  Emergency Medicine PGY-2  [facility]  October 04, 2022  9:36 AM    Patient seen with Attending, Dr. Lawerance Bach

## 2022-10-04 NOTE — ED Triage Notes (Signed)
Pt arrived via Triage ambulatory c/o elbow pain for 3 months. 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 Pain radiates to the fingers and shoulder.

## 2023-06-11 IMAGING — DX DG CHEST 1V PORT
1 series · 1 of 1 positions shown · non-contrast
Comparison: Chest x-ray 09/28/2015.

CLINICAL DATA: 35-year-old male with history of cough. Flu-like
symptoms.

EXAM:
PORTABLE CHEST 1 VIEW

[chest ap]
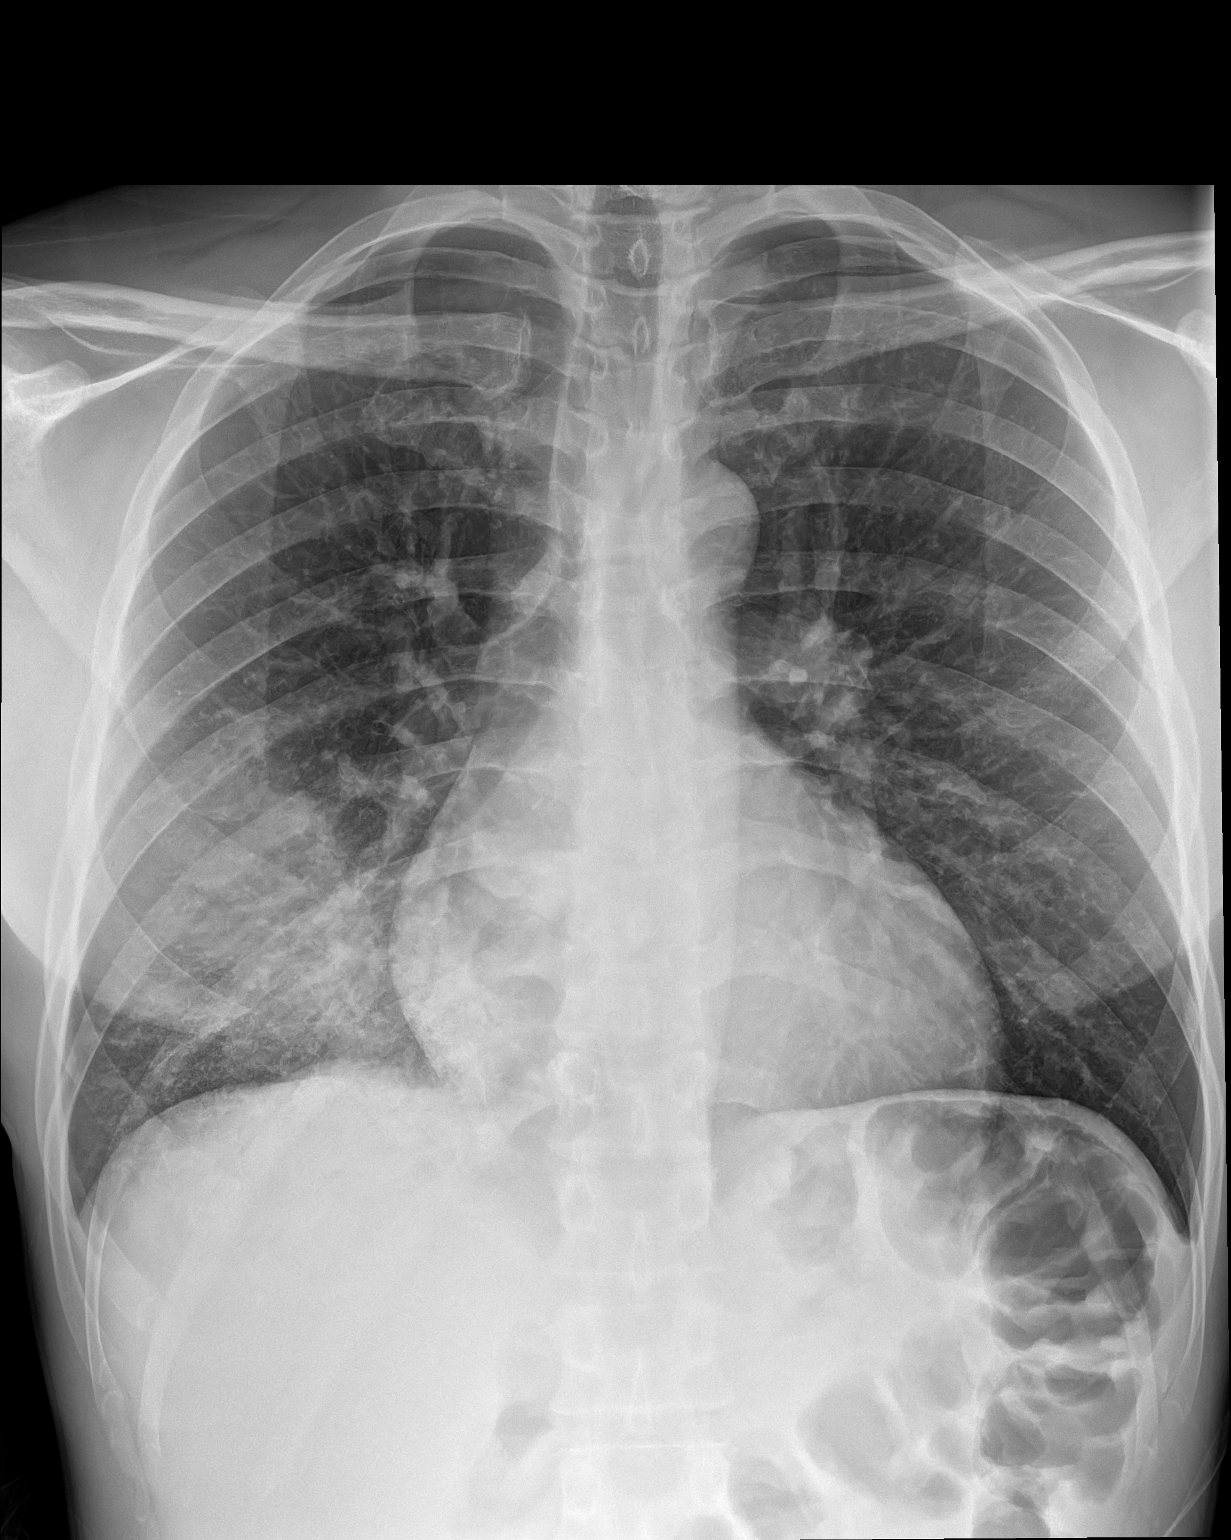

[1 of 1 positions shown; findings below may reference images not displayed]

FINDINGS: New opacity with air bronchograms in the right lower lobe. Left lung
is clear. No pleural effusions. No pneumothorax. No evidence of
pulmonary edema. Heart size is normal. Upper mediastinal contours
are within normal limits.
IMPRESSION: 1. Right lower lobe pneumonia. Followup PA and lateral chest X-ray
is recommended in 3-4 weeks following trial of antibiotic therapy to
ensure resolution and exclude underlying malignancy.

## 2023-08-22 DIAGNOSIS — T1592XA Foreign body on external eye, part unspecified, left eye, initial encounter: Secondary | ICD-10-CM

## 2023-08-22 NOTE — ED Triage Notes (Signed)
 Pt arrived via Triage ambulatory c/o left eye injury. States he was sanding down a nail 3 days ago and something flew into his eye. He is concerned a piece of the nail is in his eye still. Eye is red.

## 2023-08-22 NOTE — ED Notes (Signed)
 Pt signed Emtala, and report given to Josephus Nida, RN at [facility], pt driven to [facility] ER by family per their request. Emtala sent with pt listing accepting MD's, and family verbalizes understanding of pt's need to go directly from ER to ER.

## 2023-08-22 NOTE — ED Provider Notes (Signed)
 EMERGENCY DEPARTMENT HISTORY AND PHYSICAL EXAM      Date: 08/22/2023  Patient Name: Kyle Frank    History of Presenting Illness     Chief Complaint   Patient presents with    Eye Injury       History (Context): Kyle Frank is a 38 y.o. male  presents to the ED today with chief complaint of foreign body left eye.  Patient has pain and blurry vision.  This happened 3 days ago when he was grinding metal.      PCP: No primary care provider on file.    No current facility-administered medications for this encounter.     Current Outpatient Medications   Medication Sig Dispense Refill    ibuprofen  (ADVIL ;MOTRIN ) 600 MG tablet Take 1 tablet by mouth 4 times daily as needed for Pain 40 tablet 0       Past History     Past Medical History:   No past medical history on file.    Past Surgical History:  No past surgical history on file.    Family History:  No family history on file.    Social History:        Allergies:  No Known Allergies      Physical Exam     Vitals:    08/22/23 2049   BP: 110/75   Pulse: 85   Resp: 18   Temp: 98.2 F (36.8 C)   TempSrc: Oral   SpO2: 97%   Weight: 63.5 kg (140 lb)   Height: 1.778 m (5' 10)       Physical Exam  Vitals and nursing note reviewed.   HENT:      Head: Normocephalic.      Nose: Nose normal.      Mouth/Throat:      Mouth: Mucous membranes are moist.   Eyes:      Comments: Patient has metallic fluid in medial aspect of left eye no corneal abrasion noted   Cardiovascular:      Rate and Rhythm: Normal rate and regular rhythm.   Pulmonary:      Effort: Pulmonary effort is normal.   Abdominal:      Palpations: Abdomen is soft.   Skin:     General: Skin is warm.      Capillary Refill: Capillary refill takes less than 2 seconds.   Neurological:      Mental Status: He is alert and oriented to person, place, and time.   Psychiatric:         Mood and Affect: Mood normal.         Diagnostic Study Results     Labs -   No results found for this or any previous visit (from the past 12  hours). Labs Reviewed - No data to display    Radiologic Studies -   No orders to display         The laboratory results, imaging results, and other diagnostic exams were reviewed in the EMR.    Medical Decision Making   I am the first provider for this patient.    I reviewed the vital signs, available nursing notes, past medical history, past surgical history, family history and social history.    Vital Signs-Reviewed the patient's vital signs.    ED EKG interpretation:   This EKG was interpreted by Robb Child MD         Records Reviewed: Personally, on initial evaluation    MDM:   DDX  includes but is not limited to: Foreign body to eye corneal abrasion orbit rupture    Patient overall stable            Will discharge patient home with strict return precautions and follow up recommendations. Patient verbalized understanding and is without further questions.      Orders as below:  No orders of the defined types were placed in this encounter.         ED Course:            Procedures:  Procedures      Rhythm interpretation from monitor:      Social Determinants of Health: No PCP       Supplemental Historians include:        Documentation/Prior Results Review:  Nursing notes.      Discussion of Mangement with other Physicians, QHP or Appropriate Source:              Diagnosis and Disposition     CLINICAL IMPRESSION:  1. Foreign body of left eye, initial encounter         Medication List        ASK your doctor about these medications      ibuprofen  600 MG tablet  Commonly known as: ADVIL ;MOTRIN   Take 1 tablet by mouth 4 times daily as needed for Pain              Disposition: Transfer    Patient condition at time of disposition: Stable    DISCHARGE NOTE:   Pt has been reexamined. Patient has no new complaints, changes, or physical findings.  Care plan outlined and precautions discussed.  Results were reviewed with the patient. All medications were reviewed with the patient. All of pt's questions and concerns were  addressed.  Alarm symptoms and return precautions associated with chief complaint and evaluation were reviewed with the patient in detail.  The patient demonstrated adequate understanding.  Patient was instructed to follow up with PCP, as well as given strict return precautions to the ED upon further deterioration. Patient is ready for discharge home.          Dragon Disclaimer     Please note that this dictation was completed with Dragon, the Advertising account planner.  Quite often unanticipated grammatical, syntax, homophones, and other interpretive errors are inadvertently transcribed by the computer software.  Please disregard these errors.  Please excuse any errors that have escaped final proofreading.      Ashwin Tibbs A. Letty Raya, MD        Brigida Canal, MD  08/22/23 2239

## 2023-08-23 ENCOUNTER — Inpatient Hospital Stay
Admit: 2023-08-23 | Discharge: 2023-08-23 | Disposition: A | Discharge status: Other Acute Inpatient Hospital | Attending: Emergency Medicine

## 2023-08-23 MED ORDER — TETRACAINE HCL 0.5 % OP SOLN
0.5 | Freq: Once | OPHTHALMIC | Status: AC
Start: 2023-08-23 — End: 2023-08-22
  Administered 2023-08-23: 01:00:00 2 [drp] via OPHTHALMIC

## 2023-08-23 MED ORDER — FLUORESCEIN SODIUM 1 MG OP STRP
1 | OPHTHALMIC | Status: AC
Start: 2023-08-23 — End: 2023-08-22
  Administered 2023-08-23: 02:00:00 1 via OPHTHALMIC

## 2023-08-23 MED FILL — FLUORESCEIN SODIUM 1 MG OP STRP: 1 mg | OPHTHALMIC | Qty: 1 | Fill #0

## 2023-08-23 MED FILL — TETRACAINE HCL 0.5 % OP SOLN: 0.5 % | OPHTHALMIC | Qty: 8 | Fill #0

## 2023-12-21 ENCOUNTER — Inpatient Hospital Stay
Admit: 2023-12-21 | Discharge: 2023-12-21 | Disposition: A | Discharge status: Home / Self Care | Arrived: VH | Attending: Emergency Medicine

## 2023-12-21 DIAGNOSIS — M67472 Ganglion, left ankle and foot: Secondary | ICD-10-CM

## 2023-12-21 MED ORDER — LIDOCAINE HCL (PF) 1 % IJ SOLN
1 | INTRAMUSCULAR | Status: AC
Start: 2023-12-21 — End: 2023-12-21
  Administered 2023-12-21: 12:00:00 5 mL via INTRADERMAL

## 2023-12-21 MED FILL — LIDOCAINE HCL (PF) 1 % IJ SOLN: 1 % | INTRAMUSCULAR | Qty: 5 | Fill #0

## 2023-12-21 NOTE — Discharge Instructions (Signed)
"  Come back if you get worse!! Follow up without fail.  "

## 2023-12-21 NOTE — ED Notes (Signed)
"  Pt's foot wrapped with ace wrap.  Pt able to move and feel toes  "

## 2023-12-21 NOTE — ED Notes (Signed)
 "Emergency Physician Note  [facility]  [facility] EMERGENCY DEPARTMENT  [address]  [place] [address]  Dept: [phone]  Loc: [phone]  Open Note Time:  7:11 AM EDT    Chief Complaint  Cyst       History of Present Illness  Kyle Frank is a 38 y.o. male without significant PMHx who presents to the ED for ganglion cyst over the top of his foot while wearing shoes 1 month ago. Patient reports no pain while at rest with socks off but notes the discomfort affects his day-to-day functioning and produces painful tingling effect. This is the first occurrence of these symptoms. Pt denies recent fevers, chills, HA, lightheadedness, pain elsewhere over the foot, weakness, overt numbness, or further symptoms at this time    ROS  Clinically pertinent medically relevant review of systems per HPI.    @EMPHISTORIES @   has no past medical history on file.    Nursing notes reviewed.    ED Triage Vitals [12/21/23 0434]   Encounter Vitals Group      BP 119/86      Girls Systolic BP Percentile       Girls Diastolic BP Percentile       Boys Systolic BP Percentile       Boys Diastolic BP Percentile       Pulse 50      Respirations 17      Temp 98.2 F (36.8 C)      Temp Source Oral      SpO2 100 %      Weight       Height       Head Circumference       Peak Flow       Pain Score       Pain Loc       Pain Education       Exclude from Growth Chart        PHYSICAL EXAM  There is no height or weight on file to calculate BMI.  Physical Exam  Vitals and nursing note reviewed.   Constitutional:       General: He is not in acute distress.     Appearance: Normal appearance. He is not ill-appearing or toxic-appearing.   HENT:      Head: Normocephalic and atraumatic.      Nose: Nose normal.      Mouth/Throat:      Mouth: Mucous membranes are moist.      Pharynx: Oropharynx is clear.   Eyes:      Extraocular Movements: Extraocular movements intact.      Conjunctiva/sclera: Conjunctivae normal.   Cardiovascular:       Rate and Rhythm: Normal rate and regular rhythm.      Pulses: Normal pulses.      Heart sounds: No murmur heard.  Pulmonary:      Effort: Pulmonary effort is normal. No respiratory distress.      Breath sounds: Normal breath sounds.   Abdominal:      Palpations: Abdomen is soft.      Tenderness: There is no abdominal tenderness.   Musculoskeletal:         General: Tenderness and deformity present. No signs of injury. Normal range of motion.      Cervical back: Normal range of motion.      Comments: Over dorsal left foot. Strength 5/5 to bilateral LEs   Skin:     General: Skin is warm.  Capillary Refill: Capillary refill takes less than 2 seconds.   Neurological:      General: No focal deficit present.      Mental Status: He is alert and oriented to person, place, and time.      Sensory: No sensory deficit.      Motor: No weakness.      Gait: Gait normal.      Deep Tendon Reflexes: Reflexes normal.   Psychiatric:         Mood and Affect: Mood normal.         Behavior: Behavior normal.         Thought Content: Thought content normal.           LABS and DIAGNOSTIC RESULTS  @sgekgread @    RADIOLOGY    POCUS shows small 1.5*1cm well-circumscribed hypoechoic lesion with thin walls and few septations. Compressible and avascular on Doppler US . No solid mass identified    LABS  No results found for this visit on 12/21/23.    SCREENINGS  NIH Score     Glascow  Glasgow Coma Scale  Eye Opening: Spontaneous  Best Verbal Response: Oriented  Best Motor Response: Obeys commands  Glasgow Coma Scale Score: 15  Glascow Peds    Heart Score         PROCEDURES  Unless otherwise noted below, none    Aspiration of cyst    Date/Time: 12/21/2023 7:50 AM    Performed by: [name], MD  Authorized by: [name], MD  Consent: Verbal consent obtained  Risks and benefits: risks, benefits and alternatives were discussed  Consent given by: patient  Patient understanding: patient states understanding of the procedure being  performed  Patient consent: the patient's understanding of the procedure matches consent given  Patient identity confirmed: verbally with patient and arm band  Local anesthesia used: yes    Anesthesia:  Local anesthesia used: yes  Local Anesthetic: lidocaine  1% without epinephrine  Anesthetic total: 5 mL    Sedation:  Patient sedated: no    Number of attempts: 1  Comments: Small amounts of serous fluid aspirated. Pt tolerated well without complaints or complications            @SEP1ALLCHARTS @    I evaluated the patient in room D01/D01      Consults ordered:  None  Discussion with Other Professionals : None    Social Determinants : None    Chronic Conditions:  See HPI and PMHx    Records Reviewed : None    Appropriate for outpatient management with Podiatry follow up         Medical Decision Making & Disposition Considerations   (include 1 Tests not done, Shared Decision Making, Pt expectation of Test or Tx.):    Differential diagnosis includes but is not limited to simple ganglion cysts as seen on ultrasound exam without vessel involvement, increased septations, or complex features. Patient educated on risks/benefits of observation vs aspiration and preferred aspiration procedure at this time. Verbalizes understanding of risk of recurrence or damage to local structures during procedure       Disposition  At this point I do not feel the patient requires further work up and it is reasonable to discharge the patient. I had a discussion with the patient and/or their surrogate regarding diagnosis, diagnostic testing results, treatment/ plan of care, and follow up.  Patient and/or companions verbalized understanding of the ED workup, any relevant findings as well as any incidental findings,  and the disposition and plan.  There was shared decision-making between myself as well as the patient and/or their surrogate and we are all in agreement with discharge home.  There was an opportunity for questions and all questions  were answered to the best of my ability and to the satisfaction of the patient and/or patient's family. Patient agreed to follow up as recommend for further evaluation/treatment. The patient was given strict return precautions as we discussed symptoms that would necessitate return to the ED. Patient will return to ED for new/worsening symptoms. The patient verbalized their understanding and agreement with the above plan.  Please refer to AVS for further details regarding discharge instructions.    Pt is in good condition upon Discharge to home.    The note was completed using Dragon voice recognition transcription. Every effort was made to ensure accuracy; however, inadvertent transcription errors may be present despite my best efforts to edit errors.    Michai Dieppa A. Corinthia, MD     [name], MD  12/24/23 [phone]    "

## 2023-12-21 NOTE — ED Triage Notes (Signed)
"  States he has what he thinks is a ganglion cyst on his big toe.  Making the topof his foot numb  "
